# Patient Record
Sex: Male | Born: 1965 | Race: Black or African American | Hispanic: No | Marital: Single | Smoking: Never smoker
Health system: Southern US, Community
[De-identification: ages and names within clinical notes are randomized; demographics above are authoritative.]

## PROBLEM LIST (undated history)

## (undated) DIAGNOSIS — Z8669 Personal history of other diseases of the nervous system and sense organs: Secondary | ICD-10-CM

## (undated) DIAGNOSIS — E119 Type 2 diabetes mellitus without complications: Secondary | ICD-10-CM

## (undated) DIAGNOSIS — K429 Umbilical hernia without obstruction or gangrene: Secondary | ICD-10-CM

## (undated) DIAGNOSIS — I10 Essential (primary) hypertension: Secondary | ICD-10-CM

## (undated) DIAGNOSIS — Z87898 Personal history of other specified conditions: Secondary | ICD-10-CM

## (undated) DIAGNOSIS — K5900 Constipation, unspecified: Secondary | ICD-10-CM

## (undated) DIAGNOSIS — R9431 Abnormal electrocardiogram [ECG] [EKG]: Secondary | ICD-10-CM

## (undated) DIAGNOSIS — N138 Other obstructive and reflux uropathy: Secondary | ICD-10-CM

## (undated) DIAGNOSIS — K6389 Other specified diseases of intestine: Secondary | ICD-10-CM

## (undated) DIAGNOSIS — K219 Gastro-esophageal reflux disease without esophagitis: Secondary | ICD-10-CM

## (undated) DIAGNOSIS — N289 Disorder of kidney and ureter, unspecified: Secondary | ICD-10-CM

## (undated) HISTORY — DX: Disorder of kidney and ureter, unspecified: N28.9

## (undated) HISTORY — PX: INGUINAL HERNIA REPAIR: SUR1180

## (undated) HISTORY — DX: Other specified diseases of intestine: K63.89

---

## 1999-04-21 ENCOUNTER — Encounter: Payer: Self-pay | Admitting: Gastroenterology

## 1999-04-21 ENCOUNTER — Encounter: Admission: RE | Admit: 1999-04-21 | Discharge: 1999-04-21 | Payer: Self-pay | Admitting: Gastroenterology

## 2001-03-23 ENCOUNTER — Emergency Department (HOSPITAL_COMMUNITY): Admission: EM | Admit: 2001-03-23 | Discharge: 2001-03-23 | Payer: Self-pay | Admitting: Emergency Medicine

## 2002-01-15 ENCOUNTER — Emergency Department (HOSPITAL_COMMUNITY): Admission: EM | Admit: 2002-01-15 | Discharge: 2002-01-15 | Payer: Self-pay | Admitting: Emergency Medicine

## 2002-01-24 ENCOUNTER — Encounter: Admission: RE | Admit: 2002-01-24 | Discharge: 2002-01-24 | Payer: Self-pay | Admitting: Gastroenterology

## 2002-01-24 ENCOUNTER — Encounter: Payer: Self-pay | Admitting: Gastroenterology

## 2002-02-12 ENCOUNTER — Ambulatory Visit (HOSPITAL_COMMUNITY): Admission: RE | Admit: 2002-02-12 | Discharge: 2002-02-12 | Payer: Self-pay | Admitting: Gastroenterology

## 2002-11-02 ENCOUNTER — Encounter: Payer: Self-pay | Admitting: Emergency Medicine

## 2002-11-02 ENCOUNTER — Emergency Department (HOSPITAL_COMMUNITY): Admission: EM | Admit: 2002-11-02 | Discharge: 2002-11-02 | Payer: Self-pay

## 2003-01-14 ENCOUNTER — Ambulatory Visit (HOSPITAL_COMMUNITY): Admission: RE | Admit: 2003-01-14 | Discharge: 2003-01-14 | Payer: Self-pay | Admitting: Gastroenterology

## 2006-05-30 ENCOUNTER — Ambulatory Visit: Payer: Self-pay | Admitting: Vascular Surgery

## 2006-07-10 ENCOUNTER — Emergency Department (HOSPITAL_COMMUNITY): Admission: EM | Admit: 2006-07-10 | Discharge: 2006-07-10 | Payer: Self-pay | Admitting: Emergency Medicine

## 2006-10-23 ENCOUNTER — Ambulatory Visit: Payer: Self-pay | Admitting: Vascular Surgery

## 2006-10-30 ENCOUNTER — Ambulatory Visit: Payer: Self-pay | Admitting: Vascular Surgery

## 2007-01-29 ENCOUNTER — Ambulatory Visit: Payer: Self-pay | Admitting: Vascular Surgery

## 2008-10-11 ENCOUNTER — Emergency Department (HOSPITAL_COMMUNITY): Admission: EM | Admit: 2008-10-11 | Discharge: 2008-10-11 | Payer: Self-pay | Admitting: Emergency Medicine

## 2009-08-01 ENCOUNTER — Emergency Department (HOSPITAL_COMMUNITY): Admission: EM | Admit: 2009-08-01 | Discharge: 2009-08-01 | Payer: Self-pay | Admitting: Emergency Medicine

## 2009-08-21 ENCOUNTER — Telehealth: Payer: Self-pay | Admitting: Gastroenterology

## 2010-03-09 NOTE — Progress Notes (Signed)
Summary: Schedule Colonoscopy  Phone Note Outgoing Call Call back at Home Phone [phone]   Call placed by: Harlow Mares CMA Duncan Dull),  August 21, 2009 11:33 AM Call placed to: Patient Summary of Call: Left message on patients machine to call back, pt needs to schedule a colonoscopy Initial call taken by: Harlow Mares CMA Duncan Dull),  August 21, 2009 11:33 AM  Follow-up for Phone Call        Left a message on the patient machine to call back and schedule a previsit and procedure with our office. A letter will be mailed to the patient.   Follow-up by: Harlow Mares CMA Duncan Dull),  August 28, 2009 4:13 PM

## 2010-05-14 LAB — DIFFERENTIAL
Lymphocytes Relative: 21 % (ref 12–46)
Lymphs Abs: 1.5 10*3/uL (ref 0.7–4.0)
Monocytes Absolute: 0.3 10*3/uL (ref 0.1–1.0)
Monocytes Relative: 4 % (ref 3–12)
Neutro Abs: 4.7 10*3/uL (ref 1.7–7.7)

## 2010-05-14 LAB — CBC
Hemoglobin: 15.3 g/dL (ref 13.0–17.0)
MCHC: 33.9 g/dL (ref 30.0–36.0)
RBC: 5.21 MIL/uL (ref 4.22–5.81)
WBC: 7.4 10*3/uL (ref 4.0–10.5)

## 2010-05-14 LAB — POCT I-STAT, CHEM 8
BUN: 17 mg/dL (ref 6–23)
Calcium, Ion: 1.18 mmol/L (ref 1.12–1.32)
Chloride: 103 mEq/L (ref 96–112)
Creatinine, Ser: 1.3 mg/dL (ref 0.4–1.5)
Glucose, Bld: 170 mg/dL — ABNORMAL HIGH (ref 70–99)

## 2010-06-22 NOTE — Procedures (Signed)
DUPLEX DEEP VENOUS EXAM - LOWER EXTREMITY   INDICATION:  Follow up left greater saphenous vein closure.   HISTORY:  Edema:  No.  Trauma/Surgery:  Yes.  Pain:  No.  PE:  No.  Previous DVT:  No.  Anticoagulants:  Other:   DUPLEX EXAM:                CFV   SFV   PopV  PTV    GSV                R  L  R  L  R  L  R   L  R  L  Thrombosis    o  o     o     o      o     o  Spontaneous   +  +     +     +      +     +  Phasic        +  +     +     +      +     +  Augmentation  +  +     +     +      +     +  Compressible  +  +     +     +      +     +  Competent     +  +     +     +      +     +   Legend:  + - yes  o - no  p - partial  D - decreased   IMPRESSION:  1. No evidence of  deep venous thrombosis noted in left leg.  2. Left greater saphenous appears closed status post left greater      saphenous vein closure.  3. A perforator that measured 0.62 cm noted in the left calf region.    _____________________________  Quita Skye Hart [place], M.D.   MG/MEDQ  D:  01/29/2007  T:  01/30/2007  Job:  [id number]

## 2010-06-22 NOTE — Assessment & Plan Note (Signed)
OFFICE VISIT   Stanley Chapman, Stanley Chapman  DOB:  25-Jul-1965                                       10/30/2006  ZOXWR#:[id number]   The patient underwent laser ablation of his left greater saphenous vein  with multiple stab phlebectomies on 10/23/2006 for severe venous  insufficiency and painful varicosities.  He has had an excellent early  result with no evidence of any distal swelling or pain in the calf or  ankle area.  He does have some mild to moderate tenderness over the  course of the greater saphenous vein, which is improving on a daily  basis.  He has taken his ibuprofen as prescribed and has worn the  elastic compression stocking.  I performed a limited venous duplex exam  today in the office.  His deep venous system is widely patent with a  normal flow and no evidence of deep venous thrombosis.  Saphenous vein  is occluded from just proximal to the saphenofemoral junction to the  knee and totally non-compressible.  He was reassured regarding these  findings.  He will return in 3 months for final follow up.   Quita Skye Hart Rochester, M.D.  Electronically Signed   JDL/MEDQ  D:  10/30/2006  Chapman:  10/31/2006  Job:  [id number]

## 2010-06-22 NOTE — Assessment & Plan Note (Signed)
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  He had greater  than 20 stab phlebectomy sites as well.  This has healed nicely and he  does have a few small residual varicosities in the calf.  He states his  leg feels much better than it did prior to his laser ablation procedure,  with the heaviness, aching, and throbbing gone now.  He does have mild  edema in the left leg in the pretibial region.  He was concerned about a  few dark spots, which represent some areas of hyperpigmentation, which  seem to be stable and should not progress.  He has excellent arterial  pulses distally and well-perfused lower extremities.   Venous duplex exam was performed in the lab today, which revealed  successful closure of the left greater saphenous vein.  There is 1  prominent perforator in the calf measuring 0.62 cm communicating with  the superficial and deep system, which could be addressed if he develops  worsening varicosities, et Karie Soda.  Return to see [place] on a p.r.n. basis.   Quita Skye Hart [place], M.D.  Electronically Signed   JDL/MEDQ  D:  01/29/2007  T:  01/30/2007  Job:  [id number]

## 2010-06-25 NOTE — Op Note (Signed)
   NAMEGAYLEN, Stanley Chapman                       ACCOUNT NO.:  [id number]   MEDICAL RECORD NO.:  [id number]                   PATIENT TYPE:  AMB   LOCATION:  ENDO                                 FACILITY:  [facility]   PHYSICIAN:  John C. Madilyn Fireman, M.D.                 DATE OF BIRTH:  September 06, 1965   DATE OF PROCEDURE:  02/12/2002  DATE OF DISCHARGE:                                 OPERATIVE REPORT   PROCEDURE:  Colonoscopy.   INDICATIONS FOR PROCEDURE:  Worsening right lower quadrant abdominal pain  very bothersome to the patient with a history of metastatic cancer of  unknown origin in his mother.  CT scan has been unrevealing.  He has had  some sort of surgery involving a right hernia in the past. Procedure is to  assess the cecum, terminal ileum, and appendix for any obvious source of his  pain.   DESCRIPTION OF PROCEDURE:  The patient was placed in the left lateral  decubitus position and placed on the pulse monitor with continuous low-flow  oxygen delivered by nasal cannula.  He was sedated with 100 mcg IV fentanyl  and 10 mg IV Versed.  The Olympus video colonoscope was inserted into the  rectum and advanced to the cecum, confirmed by transillumination at  McBurney's point and visualization of the ileocecal valve and appendiceal  orifice.  The terminal ileum was intubated and appeared to be within normal  limits as did the appendiceal orifice and cecum.  The ascending colon  likewise appeared normal as did the transverse, descending, sigmoid, and  rectum.  The scope was then withdrawn, and the patient returned to the  recovery room in stable condition.  He tolerated the procedure well, and  there were no immediate complications.   IMPRESSION:  Normal colonoscopy including terminal ileum.   PLAN:  I am not sure what else to offer the patient.  He was interested in  surgical consult regarding possible pain related to his prior hernia, and we  will arrange this if he desires.                                            John C. Madilyn Fireman, M.D.    JCH/MEDQ  D:  02/12/2002  T:  02/12/2002  Job:  [id number]

## 2010-11-25 LAB — I-STAT 8, (EC8 V) (CONVERTED LAB)
Bicarbonate: 26.9 — ABNORMAL HIGH
Hemoglobin: 17
Operator id: 151321
Sodium: 140
TCO2: 28
pCO2, Ven: 51.1 — ABNORMAL HIGH

## 2010-11-25 LAB — POCT I-STAT CREATININE: Operator id: 151321

## 2010-11-25 LAB — POCT CARDIAC MARKERS
CKMB, poc: 2.3
Troponin i, poc: <0.05

## 2011-03-19 ENCOUNTER — Emergency Department (HOSPITAL_COMMUNITY): Payer: Self-pay

## 2011-03-19 ENCOUNTER — Encounter (HOSPITAL_COMMUNITY): Payer: Self-pay | Admitting: *Deleted

## 2011-03-19 ENCOUNTER — Observation Stay (HOSPITAL_COMMUNITY): Payer: Self-pay

## 2011-03-19 ENCOUNTER — Other Ambulatory Visit: Payer: Self-pay

## 2011-03-19 ENCOUNTER — Observation Stay (HOSPITAL_COMMUNITY)
Admission: EM | Admit: 2011-03-19 | Discharge: 2011-03-19 | Disposition: A | Discharge status: Home / Self Care | Payer: Self-pay | Attending: Internal Medicine | Admitting: Internal Medicine

## 2011-03-19 DIAGNOSIS — R079 Chest pain, unspecified: Principal | ICD-10-CM | POA: Insufficient documentation

## 2011-03-19 DIAGNOSIS — R739 Hyperglycemia, unspecified: Secondary | ICD-10-CM

## 2011-03-19 DIAGNOSIS — R0602 Shortness of breath: Secondary | ICD-10-CM | POA: Insufficient documentation

## 2011-03-19 LAB — POCT I-STAT, CHEM 8
BUN: 14 mg/dL (ref 6–23)
Calcium, Ion: 1.17 mmol/L (ref 1.12–1.32)
Chloride: 102 mEq/L (ref 96–112)
Glucose, Bld: 158 mg/dL — ABNORMAL HIGH (ref 70–99)
HCT: 47 % (ref 39.0–52.0)
Hemoglobin: 16 g/dL (ref 13.0–17.0)

## 2011-03-19 LAB — DIFFERENTIAL
Basophils Absolute: 0 10*3/uL (ref 0.0–0.1)
Basophils Relative: 1 % (ref 0–1)
Eosinophils Absolute: 0.4 10*3/uL (ref 0.0–0.7)
Eosinophils Relative: 6 % — ABNORMAL HIGH (ref 0–5)
Monocytes Absolute: 0.4 10*3/uL (ref 0.1–1.0)
Monocytes Relative: 6 % (ref 3–12)
Neutro Abs: 3.5 10*3/uL (ref 1.7–7.7)
Neutrophils Relative %: 51 % (ref 43–77)

## 2011-03-19 LAB — POCT I-STAT TROPONIN I: Troponin i, poc: 0 ng/mL (ref 0.00–0.08)

## 2011-03-19 LAB — CBC
HCT: 44.4 % (ref 39.0–52.0)
MCH: 29.1 pg (ref 26.0–34.0)
MCHC: 34.7 g/dL (ref 30.0–36.0)
RBC: 5.29 MIL/uL (ref 4.22–5.81)
RDW: 13.2 % (ref 11.5–15.5)

## 2011-03-19 LAB — HEMOGLOBIN A1C: Mean Plasma Glucose: 146 mg/dL — ABNORMAL HIGH (ref <117)

## 2011-03-19 MED ORDER — METOPROLOL TARTRATE 1 MG/ML IV SOLN
5.0000 mg | Freq: Once | INTRAVENOUS | Status: AC
  Administered 2011-03-19: 5 mg via INTRAVENOUS
  Filled 2011-03-19: qty 5

## 2011-03-19 MED ORDER — IOHEXOL 350 MG/ML SOLN: 80.0000 mL | Freq: Once | INTRAVENOUS | Status: DC | PRN

## 2011-03-19 MED ORDER — SODIUM CHLORIDE 0.9 % IV SOLN
Freq: Once | INTRAVENOUS | Status: AC
  Administered 2011-03-19: 08:00:00 via INTRAVENOUS

## 2011-03-19 MED ORDER — METOPROLOL TARTRATE 1 MG/ML IV SOLN
5.0000 mg | INTRAVENOUS | Status: AC
  Administered 2011-03-19: 2.5 mg via INTRAVASCULAR
  Administered 2011-03-19: 5 mg via INTRAVENOUS

## 2011-03-19 MED ORDER — METOPROLOL TARTRATE 1 MG/ML IV SOLN
INTRAVENOUS | Status: AC
  Administered 2011-03-19: 2.5 mg via INTRAVASCULAR
  Filled 2011-03-19: qty 15

## 2011-03-19 MED ORDER — NITROGLYCERIN 0.4 MG SL SUBL
SUBLINGUAL_TABLET | SUBLINGUAL | Status: AC
  Filled 2011-03-19: qty 25

## 2011-03-19 MED ORDER — NITROGLYCERIN 0.4 MG SL SUBL
0.4000 mg | SUBLINGUAL_TABLET | Freq: Once | SUBLINGUAL | Status: AC
  Administered 2011-03-19: 0.4 mg via SUBLINGUAL

## 2011-03-19 MED ORDER — ASPIRIN 81 MG PO CHEW
324.0000 mg | CHEWABLE_TABLET | Freq: Once | ORAL | Status: AC
  Administered 2011-03-19: 324 mg via ORAL
  Filled 2011-03-19: qty 4

## 2011-03-19 NOTE — ED Notes (Signed)
Pt resting comfortably. Admitting MD at bedside.

## 2011-03-19 NOTE — ED Notes (Signed)
Bosie Clos, Secretary, to advise [facility] to cancel bed request - d/t entered incorrectly.

## 2011-03-19 NOTE — ED Provider Notes (Signed)
Complains of intermittent chest pain for 2 days asymptomatic at present. Dr. Shari Heritage had arranged for inpatient stay however patient wished to sign out AMA. I convinced him to stay for chest pain rule out protocol.Marland Kitchen He is sufficiently low risk  Doug Sou, MD 03/19/11 1719

## 2011-03-19 NOTE — ED Provider Notes (Signed)
Medical screening examination/treatment/procedure(s) were conducted as a shared visit with non-physician practitioner(s) and myself.  I personally evaluated the patient during the encounter  Doug Sou, MD 03/19/11 1702

## 2011-03-19 NOTE — ED Provider Notes (Signed)
History     CSN: [id number]  Arrival date & time 03/19/11  0535   First MD Initiated Contact with Patient 03/19/11 0559      Chief Complaint  Patient presents with  . Chest Pain    (Consider location/radiation/quality/duration/timing/severity/associated sxs/prior treatment) HPI This is a 46 year old black male with no significant past medical history. He is here with a two-day history of intermittent chest pain. He states the pain is sharp and located in a vertical line left of his sternum. It occurs about every 10 minutes and last several minutes at a time. It is accompanied by shortness of breath. It is not accompanied by nausea or diaphoresis. There are no known exacerbating or mitigating factors. The pain is worsening, and has been an 8/10 at its worst. He has a family history of diabetes but no coronary artery disease. He complains of frequent urination and pitting edema of his lower legs.  History reviewed. No pertinent past medical history.  History reviewed. No pertinent past surgical history.  History reviewed. No pertinent family history.  History  Substance Use Topics  . Smoking status: Never Smoker   . Smokeless tobacco: Not on file  . Alcohol Use: No      Review of Systems  All other systems reviewed and are negative.    Allergies  Review of patient's allergies indicates no known allergies.  Home Medications   Current Outpatient Rx  Name Route Sig Dispense Refill  . THERA M PLUS PO TABS Oral Take 1 tablet by mouth daily.    Marland Kitchen OMEPRAZOLE 20 MG PO CPDR Oral Take 20 mg by mouth daily.      BP 151/91  Pulse 80  Temp 98.6 F (37 C)  Resp 18  SpO2 100%  Physical Exam General: Well-developed, well-nourished male in no acute distress; appearance consistent with age of record HENT: normocephalic, atraumatic Eyes: pupils equal round and reactive to light; extraocular muscles intact Neck: supple Heart: regular rate and rhythm; no murmurs, rubs or  gallops Lungs: clear to auscultation bilaterally Abdomen: soft; nondistended; nontender; no masses or hepatosplenomegaly; bowel sounds present; small reducible umbilical hernia Extremities: No deformity; full range of motion; trace edema of lower legs Neurologic: Awake, alert and oriented; motor function intact in all extremities and symmetric; no facial droop Skin: Warm and dry     ED Course  Procedures (including critical care time)     MDM  EKG Interpretation:  Date & Time: 03/19/2011 5:40 AM  Rate: 78  Rhythm: normal sinus rhythm  QRS Axis: normal  Intervals: normal  ST/T Wave abnormalities: T wave inversion in lead III and aVF; T-wave inversion in lead III previously seen  Conduction Disutrbances:left posterior fascicular block  Narrative Interpretation:   Old EKG Reviewed: Unchanged except inverted T wave in aVF as noted above  Nursing notes and vitals signs, including pulse oximetry, reviewed.  Summary of this visit's results, reviewed by myself:  Labs:  Results for orders placed during the hospital encounter of 03/19/11  CBC      Component Value Range   WBC 6.9  4.0 - 10.5 (K/uL)   RBC 5.29  4.22 - 5.81 (MIL/uL)   Hemoglobin 15.4  13.0 - 17.0 (g/dL)   HCT 40.9  81.1 - 91.4 (%)   MCV 83.9  78.0 - 100.0 (fL)   MCH 29.1  26.0 - 34.0 (pg)   MCHC 34.7  30.0 - 36.0 (g/dL)   RDW 78.2  95.6 - 21.3 (%)   Platelets  PENDING  150 - 400 (K/uL)  DIFFERENTIAL      Component Value Range   Neutrophils Relative 51  43 - 77 (%)   Neutro Abs 3.5  1.7 - 7.7 (K/uL)   Lymphocytes Relative 37  12 - 46 (%)   Lymphs Abs 2.5  0.7 - 4.0 (K/uL)   Monocytes Relative 6  3 - 12 (%)   Monocytes Absolute 0.4  0.1 - 1.0 (K/uL)   Eosinophils Relative 6 (*) 0 - 5 (%)   Eosinophils Absolute 0.4  0.0 - 0.7 (K/uL)   Basophils Relative 1  0 - 1 (%)   Basophils Absolute 0.0  0.0 - 0.1 (K/uL)  POCT I-STAT, CHEM 8      Component Value Range   Sodium 140  135 - 145 (mEq/L)   Potassium 3.6  3.5  - 5.1 (mEq/L)   Chloride 102  96 - 112 (mEq/L)   BUN 14  6 - 23 (mg/dL)   Creatinine, Ser 9.60  0.50 - 1.35 (mg/dL)   Glucose, Bld 454 (*) 70 - 99 (mg/dL)   Calcium, Ion 0.98  1.19 - 1.32 (mmol/L)   TCO2 27  0 - 100 (mmol/L)   Hemoglobin 16.0  13.0 - 17.0 (g/dL)   HCT 14.7  82.9 - 56.2 (%)  POCT I-STAT TROPONIN I      Component Value Range   Troponin i, poc 0.00  0.00 - 0.08 (ng/mL)   Comment 3             Imaging Studies: Dg Chest Port 1 View  03/19/2011  *RADIOLOGY REPORT*  Clinical Data: Chest pain and shortness of breath.  PORTABLE CHEST - 1 VIEW  Comparison: 09/09/2006  Findings: Shallow inspiration.  Heart size and pulmonary vascularity are normal for technique.  No focal airspace consolidation in the lungs.  No blunting of costophrenic angles. No pneumothorax.  IMPRESSION: No evidence of active pulmonary disease.  Original Report Authenticated By: Marlon Pel, M.D.   7:08 AM Will admit to [facility].       Hanley Seamen, MD 03/19/11 [phone]

## 2011-03-19 NOTE — ED Notes (Signed)
Pt reports intermittent CP x 2 days with radiation to (L) upper arm and (L) chest-increased pain tonight.  deneis N/V.  Reports SOB.  Denies diaphoresis.

## 2011-03-19 NOTE — ED Notes (Signed)
Spoke w/Laura, CT Tech, to advise her of pending cardiac CT.  She advised will check with CT Tech who comes in at 9am to see if can perform.

## 2011-03-19 NOTE — ED Notes (Signed)
Pt remains on CT table.  Pt's HR 62-75.

## 2011-03-19 NOTE — ED Notes (Signed)
C/o L side CP, radiates into L shoulder, comes and goes, lasts 2-3 minutes, comes sometimes ~ every 15 minutes, can't breath when the pain comes, (denies: sob, bleeding, nvd, dizziness, palpitations, diaphoresis, cough, congestion, cold sx, fever or other sx). Last BM yesterday (normal for pt, deals with constipation, takes herbal laxative), last ate snack ~ 2 hrs ago. Also mentions intermittant RLQ pain.

## 2011-03-19 NOTE — ED Provider Notes (Signed)
I have discussed with pt the chest pain protocol.  He is currently in agreement.  He would like to go home today if possible.  I have discussed case with Dr. Ethelda Chick.  Once CT is performed, I'll re-address chest pain and close f/u with a pcp.  CT was normal per radiology verbal report.  Plan to f/u with pcp in 1-2 wks for close evaluation and further management of elevated glucose.  Pt voiced understanding and compliance.  Lindley Magnus [place], [place] 03/19/11 [phone]

## 2011-03-19 NOTE — ED Notes (Signed)
Tresa Endo, CT Tech, called and advised should be ready in approx 10 minutes - pt aware.

## 2011-03-19 NOTE — ED Notes (Signed)
EDP into room. RN at [facility] attempting IV.

## 2011-03-21 MED ORDER — IOHEXOL 350 MG/ML SOLN
80.0000 mL | Freq: Once | INTRAVENOUS | Status: AC | PRN
  Administered 2011-03-21: 80 mL via INTRAVENOUS

## 2011-06-24 ENCOUNTER — Encounter: Payer: Self-pay | Admitting: Gastroenterology

## 2013-03-04 ENCOUNTER — Encounter (HOSPITAL_COMMUNITY): Payer: Self-pay | Admitting: Emergency Medicine

## 2013-03-04 ENCOUNTER — Emergency Department (INDEPENDENT_AMBULATORY_CARE_PROVIDER_SITE_OTHER)
Admission: EM | Admit: 2013-03-04 | Discharge: 2013-03-04 | Disposition: A | Discharge status: Home / Self Care | Payer: Self-pay | Source: Home / Self Care

## 2013-03-04 DIAGNOSIS — E119 Type 2 diabetes mellitus without complications: Secondary | ICD-10-CM

## 2013-03-04 DIAGNOSIS — R799 Abnormal finding of blood chemistry, unspecified: Secondary | ICD-10-CM

## 2013-03-04 DIAGNOSIS — R609 Edema, unspecified: Secondary | ICD-10-CM

## 2013-03-04 DIAGNOSIS — I10 Essential (primary) hypertension: Secondary | ICD-10-CM

## 2013-03-04 DIAGNOSIS — R7309 Other abnormal glucose: Secondary | ICD-10-CM

## 2013-03-04 DIAGNOSIS — R3589 Other polyuria: Secondary | ICD-10-CM

## 2013-03-04 DIAGNOSIS — R600 Localized edema: Secondary | ICD-10-CM

## 2013-03-04 DIAGNOSIS — R358 Other polyuria: Secondary | ICD-10-CM

## 2013-03-04 DIAGNOSIS — N400 Enlarged prostate without lower urinary tract symptoms: Secondary | ICD-10-CM

## 2013-03-04 DIAGNOSIS — R7989 Other specified abnormal findings of blood chemistry: Secondary | ICD-10-CM

## 2013-03-04 DIAGNOSIS — R739 Hyperglycemia, unspecified: Secondary | ICD-10-CM

## 2013-03-04 LAB — POCT I-STAT, CHEM 8
BUN: 19 mg/dL (ref 6–23)
CALCIUM ION: 1.28 mmol/L — AB (ref 1.12–1.23)
CREATININE: 1.4 mg/dL — AB (ref 0.50–1.35)
Chloride: 100 mEq/L (ref 96–112)
GLUCOSE: 290 mg/dL — AB (ref 70–99)
HCT: 49 % (ref 39.0–52.0)
HEMOGLOBIN: 16.7 g/dL (ref 13.0–17.0)
POTASSIUM: 3.8 meq/L (ref 3.7–5.3)
Sodium: 140 mEq/L (ref 137–147)
TCO2: 28 mmol/L (ref 0–100)

## 2013-03-04 MED ORDER — LOSARTAN POTASSIUM-HCTZ 50-12.5 MG PO TABS: 1.0000 | ORAL_TABLET | Freq: Every day | ORAL | Status: DC

## 2013-03-04 MED ORDER — TAMSULOSIN HCL 0.4 MG PO CAPS: 0.4000 mg | ORAL_CAPSULE | Freq: Every day | ORAL | Status: DC

## 2013-03-04 NOTE — ED Provider Notes (Signed)
Medical screening examination/treatment/procedure(s) were performed by non-physician practitioner and as supervising physician I was immediately available for consultation/collaboration.  Yaminah Clayborn, M.D.   Norva Bowe C Ta Fair, MD 03/04/13 1038 

## 2013-03-04 NOTE — ED Notes (Signed)
Pt c/o bilateral leg swelling onset 2 to 3 yrs No PCP Also c/o RLQ pain onset 3 yrs Alert w/no signs of acute distress.

## 2013-03-04 NOTE — Discharge Instructions (Signed)
Benign Prostatic Hyperplasia An enlarged prostate (benign prostatic hyperplasia) is common in older men. You may experience the following:  Weak urine stream.  Dribbling.  Feeling like the bladder has not emptied completely.  Difficulty starting urination.  Getting up frequently at night to urinate.  Urinating more frequently during the day. HOME CARE INSTRUCTIONS  Monitor your prostatic hyperplasia for any changes. The following actions may help to alleviate any discomfort you are experiencing:  Give yourself time when you urinate.  Stay away from alcohol.  Avoid beverages containing caffeine, such as coffee, tea, and colas, because they can make the problem worse.  Avoid decongestants, antihistamines, and some prescription medicines that can make the problem worse.  Follow up with your health care provider for further treatment as recommended. SEEK MEDICAL CARE IF:  You are experiencing progressive difficulty voiding.  Your urine stream is progressively getting narrower.  You are awaking from sleep with the urge to void more frequently.  You are constantly feeling the need to void.  You experience loss of urine, especially in small amounts. SEEK IMMEDIATE MEDICAL CARE IF:   You develop increased pain with urination or are unable to urinate.  You develop severe abdominal pain, vomiting, a high fever, or fainting.  You develop back pain or blood in your urine. MAKE SURE YOU:   Understand these instructions.  Will watch your condition.  Will get help right away if you are not doing well or get worse. Document Released: 01/24/2005 Document Revised: 09/26/2012 Document Reviewed: 06/26/2012 [facility] Patient Information 2014 [place].  Edema Edema is an abnormal build-up of fluids in tissues. Because this is partly dependent on gravity (water flows to the lowest place), it is more common in the legs and thighs (lower extremities). It is also common in the  looser tissues, like around the eyes. Painless swelling of the feet and ankles is common and increases as a person ages. It may affect both legs and may include the calves or even thighs. When squeezed, the fluid may move out of the affected area and may leave a dent for a few moments. CAUSES   Prolonged standing or sitting in one place for extended periods of time. Movement helps pump tissue fluid into the veins, and absence of movement prevents this, resulting in edema.  Varicose veins. The valves in the veins do not work as well as they should. This causes fluid to leak into the tissues.  Fluid and salt overload.  Injury, burn, or surgery to the leg, ankle, or foot, may damage veins and allow fluid to leak out.  Sunburn damages vessels. Leaky vessels allow fluid to go out into the sunburned tissues.  Allergies (from insect bites or stings, medications or chemicals) cause swelling by allowing vessels to become leaky.  Protein in the blood helps keep fluid in your vessels. Low protein, as in malnutrition, allows fluid to leak out.  Hormonal changes, including pregnancy and menstruation, cause fluid retention. This fluid may leak out of vessels and cause edema.  Medications that cause fluid retention. Examples are sex hormones, blood pressure medications, steroid treatment, or anti-depressants.  Some illnesses cause edema, especially heart failure, kidney disease, or liver disease.  Surgery that cuts veins or lymph nodes, such as surgery done for the heart or for breast cancer, may result in edema. DIAGNOSIS  Your caregiver is usually easily able to determine what is causing your swelling (edema) by simply asking what is wrong (getting a history) and examining you (doing  a physical). Sometimes x-rays, EKG (electrocardiogram or heart tracing), and blood work may be done to evaluate for underlying medical illness. TREATMENT  General treatment includes:  Leg elevation (or elevation of the  affected body part).  Restriction of fluid intake.  Prevention of fluid overload.  Compression of the affected body part. Compression with elastic bandages or support stockings squeezes the tissues, preventing fluid from entering and forcing it back into the blood vessels.  Diuretics (also called water pills or fluid pills) pull fluid out of your body in the form of increased urination. These are effective in reducing the swelling, but can have side effects and must be used only under your caregiver's supervision. Diuretics are appropriate only for some types of edema. The specific treatment can be directed at any underlying causes discovered. Heart, liver, or kidney disease should be treated appropriately. HOME CARE INSTRUCTIONS   Elevate the legs (or affected body part) above the level of the heart, while lying down.  Avoid sitting or standing still for prolonged periods of time.  Avoid putting anything directly under the knees when lying down, and do not wear constricting clothing or garters on the upper legs.  Exercising the legs causes the fluid to work back into the veins and lymphatic channels. This may help the swelling go down.  The pressure applied by elastic bandages or support stockings can help reduce ankle swelling.  A low-salt diet may help reduce fluid retention and decrease the ankle swelling.  Take any medications exactly as prescribed. SEEK MEDICAL CARE IF:  Your edema is not responding to recommended treatments. SEEK IMMEDIATE MEDICAL CARE IF:   You develop shortness of breath or chest pain.  You cannot breathe when you lay down; or if, while lying down, you have to get up and go to the window to get your breath.  You are having increasing swelling without relief from treatment.  You develop a fever over 102 F (38.9 C).  You develop pain or redness in the areas that are swollen.  Tell your caregiver right away if you have gained 03 lb/1.4 kg in 1 day or 05  lb/2.3 kg in a week. MAKE SURE YOU:   Understand these instructions.  Will watch your condition.  Will get help right away if you are not doing well or get worse. Document Released: 01/24/2005 Document Revised: 07/26/2011 Document Reviewed: 09/12/2007 [facility] Patient Information 2014 [place].  Hypertension As your heart beats, it forces blood through your arteries. This force is your blood pressure. If the pressure is too high, it is called hypertension (HTN) or high blood pressure. HTN is dangerous because you may have it and not know it. High blood pressure may mean that your heart has to work harder to pump blood. Your arteries may be narrow or stiff. The extra work puts you at risk for heart disease, stroke, and other problems.  Blood pressure consists of two numbers, a higher number over a lower, 110/72, for example. It is stated as "110 over 72." The ideal is below 120 for the top number (systolic) and under 80 for the bottom (diastolic). Write down your blood pressure today. You should pay close attention to your blood pressure if you have certain conditions such as:  Heart failure.  Prior heart attack.  Diabetes  Chronic kidney disease.  Prior stroke.  Multiple risk factors for heart disease. To see if you have HTN, your blood pressure should be measured while you are seated with your arm  held at the level of the heart. It should be measured at least twice. A one-time elevated blood pressure reading (especially in the Emergency Department) does not mean that you need treatment. There may be conditions in which the blood pressure is different between your right and left arms. It is important to see your caregiver soon for a recheck. Most people have essential hypertension which means that there is not a specific cause. This type of high blood pressure may be lowered by changing lifestyle factors such as:  Stress.  Smoking.  Lack of exercise.  Excessive  weight.  Drug/tobacco/alcohol use.  Eating less salt. Most people do not have symptoms from high blood pressure until it has caused damage to the body. Effective treatment can often prevent, delay or reduce that damage. TREATMENT  When a cause has been identified, treatment for high blood pressure is directed at the cause. There are a large number of medications to treat HTN. These fall into several categories, and your caregiver will help you select the medicines that are best for you. Medications may have side effects. You should review side effects with your caregiver. If your blood pressure stays high after you have made lifestyle changes or started on medicines,   Your medication(s) may need to be changed.  Other problems may need to be addressed.  Be certain you understand your prescriptions, and know how and when to take your medicine.  Be sure to follow up with your caregiver within the time frame advised (usually within two weeks) to have your blood pressure rechecked and to review your medications.  If you are taking more than one medicine to lower your blood pressure, make sure you know how and at what times they should be taken. Taking two medicines at the same time can result in blood pressure that is too low. SEEK IMMEDIATE MEDICAL CARE IF:  You develop a severe headache, blurred or changing vision, or confusion.  You have unusual weakness or numbness, or a faint feeling.  You have severe chest or abdominal pain, vomiting, or breathing problems. MAKE SURE YOU:   Understand these instructions.  Will watch your condition.  Will get help right away if you are not doing well or get worse. Document Released: 01/24/2005 Document Revised: 04/18/2011 Document Reviewed: 09/14/2007 [facility] Patient Information 2014 [place].  Peripheral Edema You have swelling in your legs (peripheral edema). This swelling is due to excess accumulation of salt and water in your body.  Edema may be a sign of heart, kidney or liver disease, or a side effect of a medication. It may also be due to problems in the leg veins. Elevating your legs and using special support stockings may be very helpful, if the cause of the swelling is due to poor venous circulation. Avoid long periods of standing, whatever the cause. Treatment of edema depends on identifying the cause. Chips, pretzels, pickles and other salty foods should be avoided. Restricting salt in your diet is almost always needed. Water pills (diuretics) are often used to remove the excess salt and water from your body via urine. These medicines prevent the kidney from reabsorbing sodium. This increases urine flow. Diuretic treatment may also result in lowering of potassium levels in your body. Potassium supplements may be needed if you have to use diuretics daily. Daily weights can help you keep track of your progress in clearing your edema. You should call your caregiver for follow up care as recommended. SEEK IMMEDIATE MEDICAL CARE IF:  You have increased swelling, pain, redness, or heat in your legs.  You develop shortness of breath, especially when lying down.  You develop chest or abdominal pain, weakness, or fainting.  You have a fever. Document Released: 03/03/2004 Document Revised: 04/18/2011 Document Reviewed: 02/11/2009 [facility] Patient Information 2014 [place].  Type 2 Diabetes Mellitus, Adult Type 2 diabetes mellitus, often simply referred to as type 2 diabetes, is a long-lasting (chronic) disease. In type 2 diabetes, the pancreas does not make enough insulin (a hormone), the cells are less responsive to the insulin that is made (insulin resistance), or both. Normally, insulin moves sugars from food into the tissue cells. The tissue cells use the sugars for energy. The lack of insulin or the lack of normal response to insulin causes excess sugars to build up in the blood instead of going into the tissue cells.  As a result, high blood sugar (hyperglycemia) develops. The effect of high sugar (glucose) levels can cause many complications. Type 2 diabetes was also previously called adult-onset diabetes but it can occur at any age.  RISK FACTORS  A person is predisposed to developing type 2 diabetes if someone in the family has the disease and also has one or more of the following primary risk factors:  Overweight.  An inactive lifestyle.  A history of consistently eating high-calorie foods. Maintaining a normal weight and regular physical activity can reduce the chance of developing type 2 diabetes. SYMPTOMS  A person with type 2 diabetes may not show symptoms initially. The symptoms of type 2 diabetes appear slowly. The symptoms include:  Increased thirst (polydipsia).  Increased urination (polyuria).  Increased urination during the night (nocturia).  Weight loss. This weight loss may be rapid.  Frequent, recurring infections.  Tiredness (fatigue).  Weakness.  Vision changes, such as blurred vision.  Fruity smell to your breath.  Abdominal pain.  Nausea or vomiting.  Cuts or bruises which are slow to heal.  Tingling or numbness in the hands or feet. DIAGNOSIS Type 2 diabetes is frequently not diagnosed until complications of diabetes are present. Type 2 diabetes is diagnosed when symptoms or complications are present and when blood glucose levels are increased. Your blood glucose level may be checked by one or more of the following blood tests:  A fasting blood glucose test. You will not be allowed to eat for at least 8 hours before a blood sample is taken.  A random blood glucose test. Your blood glucose is checked at any time of the day regardless of when you ate.  A hemoglobin A1c blood glucose test. A hemoglobin A1c test provides information about blood glucose control over the previous 3 months.  An oral glucose tolerance test (OGTT). Your blood glucose is measured  after you have not eaten (fasted) for 2 hours and then after you drink a glucose-containing beverage. TREATMENT   You may need to take insulin or diabetes medicine daily to keep blood glucose levels in the desired range.  You will need to match insulin dosing with exercise and healthy food choices. The treatment goal is to maintain the before meal blood sugar (preprandial glucose) level at 70 130 mg/dL. HOME CARE INSTRUCTIONS   Have your hemoglobin A1c level checked twice a year.  Perform daily blood glucose monitoring as directed by your caregiver.  Monitor urine ketones when you are ill and as directed by your caregiver.  Take your diabetes medicine or insulin as directed by your caregiver to maintain your blood glucose levels  in the desired range.  Never run out of diabetes medicine or insulin. It is needed every day.  Adjust insulin based on your intake of carbohydrates. Carbohydrates can raise blood glucose levels but need to be included in your diet. Carbohydrates provide vitamins, minerals, and fiber which are an essential part of a healthy diet. Carbohydrates are found in fruits, vegetables, whole grains, dairy products, legumes, and foods containing added sugars.    Eat healthy foods. Alternate 3 meals with 3 snacks.  Lose weight if overweight.  Carry a medical alert card or wear your medical alert jewelry.  Carry a 15 gram carbohydrate snack with you at all times to treat low blood glucose (hypoglycemia). Some examples of 15 gram carbohydrate snacks include:  Glucose tablets, 3 or 4   Glucose gel, 15 gram tube  Raisins, 2 tablespoons (24 grams)  Jelly beans, 6  Animal crackers, 8  Regular pop, 4 ounces (120 mL)  Gummy treats, 9  Recognize hypoglycemia. Hypoglycemia occurs with blood glucose levels of 70 mg/dL and below. The risk for hypoglycemia increases when fasting or skipping meals, during or after intense exercise, and during sleep. Hypoglycemia symptoms  can include:  Tremors or shakes.  Decreased ability to concentrate.  Sweating.  Increased heart rate.  Headache.  Dry mouth.  Hunger.  Irritability.  Anxiety.  Restless sleep.  Altered speech or coordination.  Confusion.  Treat hypoglycemia promptly. If you are alert and able to safely swallow, follow the 15:15 rule:  Take 15 20 grams of rapid-acting glucose or carbohydrate. Rapid-acting options include glucose gel, glucose tablets, or 4 ounces (120 mL) of fruit juice, regular soda, or low fat milk.  Check your blood glucose level 15 minutes after taking the glucose.  Take 15 20 grams more of glucose if the repeat blood glucose level is still 70 mg/dL or below.  Eat a meal or snack within 1 hour once blood glucose levels return to normal.    Be alert to polyuria and polydipsia which are early signs of hyperglycemia. An early awareness of hyperglycemia allows for prompt treatment. Treat hyperglycemia as directed by your caregiver.  Engage in at least 150 minutes of moderate-intensity physical activity a week, spread over at least 3 days of the week or as directed by your caregiver. In addition, you should engage in resistance exercise at least 2 times a week or as directed by your caregiver.  Adjust your medicine and food intake as needed if you start a new exercise or sport.  Follow your sick day plan at any time you are unable to eat or drink as usual.  Avoid tobacco use.  Limit alcohol intake to no more than 1 drink per day for nonpregnant women and 2 drinks per day for men. You should drink alcohol only when you are also eating food. Talk with your caregiver whether alcohol is safe for you. Tell your caregiver if you drink alcohol several times a week.  Follow up with your caregiver regularly.  Schedule an eye exam soon after the diagnosis of type 2 diabetes and then annually.  Perform daily skin and foot care. Examine your skin and feet daily for cuts,  bruises, redness, nail problems, bleeding, blisters, or sores. A foot exam by a caregiver should be done annually.  Brush your teeth and gums at least twice a day and floss at least once a day. Follow up with your dentist regularly.  Share your diabetes management plan with your workplace or school.  Stay  up-to-date with immunizations.  Learn to manage stress.  Obtain ongoing diabetes education and support as needed.  Participate in, or seek rehabilitation as needed to maintain or improve independence and quality of life. Request a physical or occupational therapy referral if you are having foot or hand numbness or difficulties with grooming, dressing, eating, or physical activity. SEEK MEDICAL CARE IF:   You are unable to eat food or drink fluids for more than 6 hours.  You have nausea and vomiting for more than 6 hours.  Your blood glucose level is over 240 mg/dL.  There is a change in mental status.  You develop an additional serious illness.  You have diarrhea for more than 6 hours.  You have been sick or have had a fever for a couple of days and are not getting better.  You have pain during any physical activity.  SEEK IMMEDIATE MEDICAL CARE IF:  You have difficulty breathing.  You have moderate to large ketone levels. MAKE SURE YOU:  Understand these instructions.  Will watch your condition.  Will get help right away if you are not doing well or get worse. Document Released: 01/24/2005 Document Revised: 10/19/2011 Document Reviewed: 08/23/2011 [facility] Patient Information 2014 [place].

## 2013-03-04 NOTE — ED Provider Notes (Signed)
CSN: [id number]     Arrival date & time 03/04/13  0825 History   First MD Initiated Contact with Patient 03/04/13 [id number]     Chief Complaint  Patient presents with  . Leg Swelling   (Consider location/radiation/quality/duration/timing/severity/associated sxs/prior Treatment) HPI Comments: 48 year old male presents with multiple chronic complaints. He has been having urinary frequency with small volume voids for 2-3 years. States he urinates every 30-45 minutes that only "a little" comes out.  He also complains of bilateral pitting edema for several years. 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  The third complaint is that of chronic right lower quadrant pain for several years. Seems to be worse after eating and then lying down.   History reviewed. No pertinent past medical history. History reviewed. No pertinent past surgical history. No family history on file. History  Substance Use Topics  . Smoking status: Former Smoker    Types: Cigarettes  . Smokeless tobacco: Not on file  . Alcohol Use: No    Review of Systems  Constitutional: Negative.   HENT: Negative.   Respiratory: Negative.   Cardiovascular: Positive for leg swelling.  Gastrointestinal: Positive for abdominal pain. Negative for vomiting and diarrhea.  Genitourinary: Positive for urgency. Negative for dysuria, penile pain and testicular pain.       As per history of present illness  Musculoskeletal: Negative.   Neurological: Negative.     Allergies  Review of patient's allergies indicates no known allergies.  Home Medications   Current Outpatient Rx  Name  Route  Sig  Dispense  Refill  . losartan-hydrochlorothiazide (HYZAAR) 50-12.5 MG per tablet   Oral   Take 1 tablet by mouth daily.   30 tablet   0   . Multiple Vitamins-Minerals (MULTIVITAMINS THER. W/MINERALS) TABS   Oral   Take 1 tablet by mouth daily.         Marland Kitchen omeprazole (PRILOSEC) 20 MG capsule   Oral   Take 20 mg by mouth daily.          BP 180/92  Pulse 76  Temp(Src) 98.8 F (37.1 C) (Oral)  Resp 14  SpO2 100% Physical Exam  Nursing note and vitals reviewed. Constitutional: He is oriented to person, place, and time. He appears well-developed and well-nourished. No distress.  HENT:  Mouth/Throat: Oropharynx is clear and moist. No oropharyngeal exudate.  Eyes: Conjunctivae and EOM are normal.  Neck: Normal range of motion. Neck supple.  Cardiovascular: Normal rate, regular rhythm, normal heart sounds and intact distal pulses.   Pulmonary/Chest: Breath sounds normal. No respiratory distress. He has no wheezes. He has no rales.  Abdominal: Soft. Bowel sounds are normal. He exhibits no distension and no mass. There is no tenderness. There is no rebound and no guarding.  Genitourinary:  Prostate is one plus enlarged. No direct tenderness. No stool in the vault. Hemoccult is negative.  Musculoskeletal: He exhibits edema.  2-3+ pitting edema of the lower extremities  Lymphadenopathy:    He has no cervical adenopathy.  Neurological: He is alert and oriented to person, place, and time.  Skin: Skin is warm and dry.  Psychiatric: He has a normal mood and affect.    ED Course  Procedures (including critical care time) Labs Review Labs Reviewed  POCT I-STAT, CHEM 8 - Abnormal; Notable for the following:    Creatinine, Ser 1.40 (*)    Glucose, Bld 290 (*)    Calcium, Ion 1.28 (*)  All other components within normal limits  OCCULT BLOOD X 1 CARD TO LAB, STOOL   Imaging Review No results found.    MDM   1. T2DM (type 2 diabetes mellitus)   2. Hyperglycemia   3. Polyuria   4. Enlarged prostate without lower urinary tract symptoms (luts)   5. Bilateral lower extremity edema   6. HTN (hypertension)   7. Elevated serum creatinine    Patient has multiple diagnoses as above and will need primary care as soon as possible for management. Today I will prescribe a trial of  Flomax to see if that will help with urinary stream. Also losartan and hydrochlorothiazide 50/12.5 mg to assist with renal flow and hypertension. He will be given information on contacting her physician as well as the [facility].      Janne Napoleon, NP 03/04/13 1012

## 2013-03-05 ENCOUNTER — Emergency Department (HOSPITAL_COMMUNITY)
Admission: EM | Admit: 2013-03-05 | Discharge: 2013-03-05 | Disposition: A | Discharge status: Home / Self Care | Payer: Self-pay | Attending: Emergency Medicine | Admitting: Emergency Medicine

## 2013-03-05 ENCOUNTER — Encounter (HOSPITAL_COMMUNITY): Payer: Self-pay | Admitting: Emergency Medicine

## 2013-03-05 DIAGNOSIS — Z79899 Other long term (current) drug therapy: Secondary | ICD-10-CM | POA: Insufficient documentation

## 2013-03-05 DIAGNOSIS — E119 Type 2 diabetes mellitus without complications: Secondary | ICD-10-CM | POA: Insufficient documentation

## 2013-03-05 DIAGNOSIS — I10 Essential (primary) hypertension: Secondary | ICD-10-CM | POA: Insufficient documentation

## 2013-03-05 DIAGNOSIS — I83893 Varicose veins of bilateral lower extremities with other complications: Secondary | ICD-10-CM | POA: Insufficient documentation

## 2013-03-05 DIAGNOSIS — Z87891 Personal history of nicotine dependence: Secondary | ICD-10-CM | POA: Insufficient documentation

## 2013-03-05 DIAGNOSIS — Z9889 Other specified postprocedural states: Secondary | ICD-10-CM | POA: Insufficient documentation

## 2013-03-05 LAB — GLUCOSE, CAPILLARY: Glucose-Capillary: 132 mg/dL — ABNORMAL HIGH (ref 70–99)

## 2013-03-05 NOTE — ED Provider Notes (Signed)
CSN: [id number]     Arrival date & time 03/05/13  0908 History   First MD Initiated Contact with Patient 03/05/13 0912     Chief Complaint  Patient presents with  . Hyperglycemia  . Leg Swelling    HPI Patient presents to the emergency room today with several complaints that have been ongoing for several months to years. Patient primarily is concerned about his elevated blood sugar and polyuria. Patient states she's been having trouble with urinary frequency. He  denies any burning or fever.  He has not had polydipsia.  The patient also has noticed pitting edema of his lower extremities for several months as well. Patient does have a history of varicose veins with prior varicose vein procedure on his left lower extremity. He does notice that he has more swelling in that leg and has also noticed that is very close veins seem to be getting larger again.  Patient also incidentally mentions having trouble with pain in his right lower abdomen for years but is currently not having any complaints. He's not having any vomiting diarrhea or fevers. His appetite is fine.  The patient went to an urgent care yesterday. He was told that he had an elevated blood sugar.  Patient was also found to be hypertensive. Patient was prescribed Flomax as well as losartan hydrochlorothiazide. The patient was not started on diabetes medications as the doctor wanted him to have further testing to determine the best treatment. The patient was given a referral to come to the health and wellness center. Patient called for an appointment and has one in March. The patient was concerned that this was too far away so he decided to come to the emergency department. Nothing has changed in his symptoms since yesterday History reviewed. No pertinent past medical history. History reviewed. No pertinent past surgical history. History reviewed. No pertinent family history. History  Substance Use Topics  . Smoking status: Former Smoker     Types: Cigarettes  . Smokeless tobacco: Not on file  . Alcohol Use: No    Review of Systems  Constitutional: Negative for fever and fatigue.  Eyes: Negative for photophobia.  Respiratory: Negative for chest tightness and shortness of breath.   Neurological: Negative for dizziness.  All other systems reviewed and are negative.    Allergies  Review of patient's allergies indicates no known allergies.  Home Medications   Current Outpatient Rx  Name  Route  Sig  Dispense  Refill  . losartan-hydrochlorothiazide (HYZAAR) 50-12.5 MG per tablet   Oral   Take 1 tablet by mouth daily.   30 tablet   0   . Multiple Vitamins-Minerals (MULTIVITAMINS THER. W/MINERALS) TABS   Oral   Take 1 tablet by mouth daily.         Marland Kitchen omeprazole (PRILOSEC) 20 MG capsule   Oral   Take 20 mg by mouth daily.         . tamsulosin (FLOMAX) 0.4 MG CAPS capsule   Oral   Take 0.4 mg by mouth.          BP 145/101  Pulse 100  Temp(Src) 98.6 F (37 C) (Oral)  Resp 18  Ht 6' (1.829 m)  Wt 210 lb (95.255 kg)  BMI 28.47 kg/m2  SpO2 99% Physical Exam  Nursing note and vitals reviewed. Constitutional: He appears well-developed and well-nourished. No distress.  HENT:  Head: Normocephalic and atraumatic.  Right Ear: External ear normal.  Left Ear: External ear normal.  Eyes: Conjunctivae  are normal. Right eye exhibits no discharge. Left eye exhibits no discharge. No scleral icterus.  Neck: Neck supple. No tracheal deviation present.  Cardiovascular: Normal rate, regular rhythm and intact distal pulses.   Pulmonary/Chest: Effort normal and breath sounds normal. No stridor. No respiratory distress. He has no wheezes. He has no rales.  Abdominal: Soft. Bowel sounds are normal. He exhibits no distension. There is no tenderness. There is no rebound and no guarding.  Musculoskeletal: He exhibits edema. He exhibits no tenderness.  Varicose veins noted left lower extremity, mild pitting edema  bilateral lower extremities  Neurological: He is alert. He has normal strength. No cranial nerve deficit (no facial droop, extraocular movements intact, no slurred speech) or sensory deficit. He exhibits normal muscle tone. He displays no seizure activity. Coordination normal.  Skin: Skin is warm and dry. No rash noted.  Psychiatric: He has a normal mood and affect.    ED Course  Procedures (including critical care time) Labs Review Labs Reviewed  GLUCOSE, CAPILLARY - Abnormal; Notable for the following:    Glucose-Capillary 132 (*)    All other components within normal limits     MDM   1. HTN (hypertension)   2. Diabetes    I reviewed the patient's visit from the urgent care yesterday. The patient did have an i-STAT blood test. At that time his blood sugar was elevated at 290 he had a creatinine of 1.4.  The patient's symptoms are not different from yesterday. His fasting blood sugar this morning is 132. Patient states he had not eaten anything before coming into the emergency department. He did not feel that he requires immediate diabetic medication considering his blood sugar this morning. He may benefit from primary care evaluation including hemoglobin A1c. I recommend him to start a carbohydrate modified diet. I will give him a referral to the diabetes education center.    I explained to the patient that his medications prescribed yesterday may help with his lower extremity. swelling and his urinary frequency.  The patient may notice some improvement in a couple of weeks. Should followup with primary care Dr.  At this time there does not appear to be any evidence of an acute emergency medical condition and the patient appears stable for discharge with appropriate outpatient follow up.     Kathalene Frames, MD 03/05/13 1009

## 2013-03-05 NOTE — Discharge Instructions (Signed)
Diabetes, Eating Away From Home Sometimes, you might eat in a restaurant or have meals that are prepared by someone else. You can enjoy eating out. However, the portions in restaurants may be much larger than needed. Listed below are some ideas to help you choose foods that will keep your blood glucose (sugar) in better control.  TIPS FOR EATING OUT  Know your meal plan and how many carbohydrate servings you should have at each meal. You may wish to carry a copy of your meal plan in your purse or wallet. Learn the foods included in each food group.  Make a list of restaurants near you that offer healthy choices. Take a copy of the carry-out menus to see what they offer. Then, you can plan what you will order ahead of time.  Become familiar with serving sizes by practicing them at home using measuring cups and spoons. Once you learn to recognize portion sizes, you will be able to correctly estimate the amount of total carbohydrate you are allowed to eat at the restaurant. Ask for a takeout box if the portion is more than you should have. When your food comes, leave the amount you should have on the plate, and put the rest in the takeout box before you start eating.  Plan ahead if your mealtime will be different from usual. Check with your caregiver to find out how to time meals and medicine if you are taking insulin.  Avoid high-fat foods, such as fried foods, cream sauces, high-fat salad dressings, or any added butter or margarine.  Do not be afraid to ask questions. Ask your server about the portion size, cooking methods, ingredients and if items can be substituted. Restaurants do not list all available items on the menu. You can ask for your main entree to be prepared using skim milk, oil instead of butter or margarine, and without gravy or sauces. Ask your waiter or waitress to serve salad dressings, gravy, sauces, margarine, and sour cream on the side. You can then add the amount your meal plan  suggests.  Add more vegetables whenever possible.  Avoid items that are labeled "jumbo," "giant," "deluxe," or "supersized."  You may want to split an entre with someone and order an extra side salad.  Watch for hidden calories in foods like croutons, bacon, or cheese.  Ask your server to take away the bread basket or chips from your table.  Order a dinner salad as an appetizer. You can eat most foods served in a restaurant. Some foods are better choices than others. Breads and Starches  Recommended: All kinds of bread (wheat, rye, white, oatmeal, [place], [place], raisin), hard or soft dinner rolls, frankfurter or hamburger buns, small bagels, small corn or whole-wheat flour tortillas.  Avoid: Frosted or glazed breads, butter rolls, egg or cheese breads, croissants, sweet rolls, pastries, coffee cake, glazed or frosted doughnuts, muffins. Crackers  Recommended: Animal crackers, graham, rye, saltine, oyster, and matzoth crackers. Bread sticks, melba toast, rusks, pretzels, popcorn (without fat), zwieback toast.  Avoid: High-fat snack crackers or chips. Buttered popcorn. Cereals  Recommended: Hot and cold cereals. Whole grains such as oatmeal or shredded wheat are good choices.  Avoid: Sugar-coated or granola type cereals. Potatoes/Pasta/Rice/Beans  Recommended: Order baked, boiled, or mashed potatoes, rice or noodles without added fat, whole beans. Order gravies, butter, margarine, or sauces on the side so you can control the amount you add.  Avoid: Hash browns or fried potatoes. Potatoes, pasta, or rice prepared with cream or  cheese sauce. Potato or pasta salads prepared with large amounts of dressing. Fried beans or fried rice. Vegetables  Recommended: Order steamed, baked, boiled, or stewed vegetables without sauces or extra fat. Ask that sauce be served on the side. If vegetables are not listed on the menu, ask what is available.  Avoid: Vegetables prepared with cream,  butter, or cheese sauce. Fried vegetables. Salad Bars  Recommended: Many of the vegetables at a salad bar are considered "free." Use lemon juice, vinegar, or low-calorie salad dressing (fewer than 20 calories per serving) as "free" dressings for your salad. Look for salad bar ingredients that have no added fat or sugar such as tomatoes, lettuce, cucumbers, broccoli, carrots, onions, and mushrooms.  Avoid: Prepared salads with large amounts of dressing, such as coleslaw, caesar salad, macaroni salad, bean salad, or carrot salad. Fruit  Recommended: Eat fresh fruit or fresh fruit salad without added dressing. A salad bar often offers fresh fruit choices, but canned fruit at a restaurant is usually packed in sugar or syrup.  Avoid: Sweetened canned or frozen fruits, plain or sweetened fruit juice. Fruit salads with dressing, sour cream, or sugar added to them. Meat and Meat Substitutes  Recommended: Order broiled, baked, roasted, or grilled meat, poultry, or fish. Trim off all visible fat. Do not eat the skin of poultry. The size stated on the menu is the raw weight. Meat shrinks by  in cooking (for example, 4 oz raw equals 3 oz cooked meat).  Avoid: Deep-fat fried meat, poultry, or fish. Breaded meats. Eggs  Recommended: Order soft, hard-cooked, poached, or scrambled eggs. Omelets may be okay, depending on what ingredients are added. Egg substitutes are also a good choice.  Avoid: Fried eggs, eggs prepared with cream or cheese sauce. Milk  Recommended: Order low-fat or fat-free milk according to your meal plan. Plain, nonfat yogurt or flavored yogurt with no sugar added may be used as a substitute for milk. Soy milk may also be used.  Avoid: Milk shakes or sweetened milk beverages. Soups and Combination Foods  Recommended: Clear broth or consomm are "free" foods and may be used as an appetizer. Broth-based soups with fat removed count as a starch serving and are preferred over cream  soups. Soups made with beans or split peas may be eaten but count as a starch.  Avoid: Fatty soups, soup made with cream, cheese soup. Combination foods prepared with excessive amounts of fat or with cream or cheese sauces. Desserts and Sweets  Recommended: Ask for fresh fruit. Sponge or angel food cake without icing, ice milk, no sugar added ice cream, sherbet, or frozen yogurt may fit into your meal plan occasionally.  Avoid: Pastries, puddings, pies, cakes with icing, custard, gelatin desserts. Fats and Oils  Recommended: Choose healthy fats such as olive oil, canola oil, or tub margarine, reduced fat or fat-free sour cream, cream cheese, avocado, or nuts.  Avoid: Any fats in excess of your allowed portion. Deep-fried foods or any food with a large amount of fat. Note: Ask for all fats to be served on the side, and limit your portion sizes according to your meal plan. Document Released: 01/24/2005 Document Revised: 04/18/2011 Document Reviewed: 08/14/2008 [facility] Patient Information 2014 [place], [place].

## 2013-03-05 NOTE — ED Notes (Signed)
Pt now complains for abd pain for over 2 years now, he would like checked out.

## 2013-03-05 NOTE — ED Notes (Signed)
Pt reports recently being [facility] at [facility] and told his sugar was elevated with no diagnosis of DM, pt reports urinating q 30 mins but no polydipsia, pt reports leg edema for 2 years

## 2013-03-05 NOTE — Discharge Planning (Signed)
[id number] Felicia E, Community Liaison  Follow up appointment made with the [facility] for Thursday Feb19,2015 at 12:00p to establish primary care, patient will also be obtaining the orange card during this visit. Patient is aware of both appointments and was given my contact information for any questions or concerns.

## 2013-03-28 ENCOUNTER — Encounter: Payer: Self-pay | Admitting: Family Medicine

## 2013-03-28 ENCOUNTER — Ambulatory Visit: Payer: Self-pay | Attending: Internal Medicine | Admitting: Family Medicine

## 2013-03-28 ENCOUNTER — Ambulatory Visit: Payer: Self-pay

## 2013-03-28 ENCOUNTER — Ambulatory Visit: Payer: Self-pay | Admitting: Home Health Services

## 2013-03-28 VITALS — BP 134/85 | HR 78 | Temp 98.2°F | Wt 211.4 lb

## 2013-03-28 DIAGNOSIS — E1165 Type 2 diabetes mellitus with hyperglycemia: Secondary | ICD-10-CM

## 2013-03-28 DIAGNOSIS — R350 Frequency of micturition: Secondary | ICD-10-CM

## 2013-03-28 DIAGNOSIS — R1013 Epigastric pain: Secondary | ICD-10-CM

## 2013-03-28 DIAGNOSIS — IMO0002 Reserved for concepts with insufficient information to code with codable children: Secondary | ICD-10-CM

## 2013-03-28 DIAGNOSIS — E118 Type 2 diabetes mellitus with unspecified complications: Secondary | ICD-10-CM

## 2013-03-28 DIAGNOSIS — I10 Essential (primary) hypertension: Secondary | ICD-10-CM

## 2013-03-28 LAB — CBC WITH DIFFERENTIAL/PLATELET
BASOS PCT: 1 % (ref 0–1)
Basophils Absolute: 0 10*3/uL (ref 0.0–0.1)
EOS ABS: 0.4 10*3/uL (ref 0.0–0.7)
EOS PCT: 8 % — AB (ref 0–5)
HEMATOCRIT: 44.7 % (ref 39.0–52.0)
HEMOGLOBIN: 15.4 g/dL (ref 13.0–17.0)
Lymphocytes Relative: 23 % (ref 12–46)
Lymphs Abs: 1.1 10*3/uL (ref 0.7–4.0)
MCH: 28.5 pg (ref 26.0–34.0)
MCHC: 34.5 g/dL (ref 30.0–36.0)
MCV: 82.6 fL (ref 78.0–100.0)
MONO ABS: 0.3 10*3/uL (ref 0.1–1.0)
MONOS PCT: 6 % (ref 3–12)
Neutro Abs: 3 10*3/uL (ref 1.7–7.7)
Neutrophils Relative %: 62 % (ref 43–77)
Platelets: 265 10*3/uL (ref 150–400)
RBC: 5.41 MIL/uL (ref 4.22–5.81)
RDW: 12.8 % (ref 11.5–15.5)
WBC: 4.8 10*3/uL (ref 4.0–10.5)

## 2013-03-28 LAB — LIPID PANEL
Cholesterol: 165 mg/dL (ref 0–200)
HDL: 44 mg/dL (ref >39)
LDL Cholesterol: 112 mg/dL — ABNORMAL HIGH (ref 0–99)
TRIGLYCERIDES: 46 mg/dL (ref <150)
Total CHOL/HDL Ratio: 3.8 Ratio
VLDL: 9 mg/dL (ref 0–40)

## 2013-03-28 LAB — POCT URINALYSIS DIPSTICK
Bilirubin, UA: NEGATIVE
Glucose, UA: NEGATIVE
KETONES UA: 15
Leukocytes, UA: NEGATIVE
Nitrite, UA: NEGATIVE
PH UA: 6.5
PROTEIN UA: NEGATIVE
RBC UA: NEGATIVE
SPEC GRAV UA: 1.02
Urobilinogen, UA: 0.2

## 2013-03-28 LAB — COMPREHENSIVE METABOLIC PANEL
ALBUMIN: 4.4 g/dL (ref 3.5–5.2)
ALK PHOS: 58 U/L (ref 39–117)
ALT: 37 U/L (ref 0–53)
AST: 31 U/L (ref 0–37)
BUN: 16 mg/dL (ref 6–23)
CALCIUM: 9.3 mg/dL (ref 8.4–10.5)
CHLORIDE: 100 meq/L (ref 96–112)
CO2: 31 mEq/L (ref 19–32)
CREATININE: 1.33 mg/dL (ref 0.50–1.35)
GLUCOSE: 153 mg/dL — AB (ref 70–99)
POTASSIUM: 3.7 meq/L (ref 3.5–5.3)
Sodium: 138 mEq/L (ref 135–145)
Total Bilirubin: 0.7 mg/dL (ref 0.2–1.2)
Total Protein: 6.9 g/dL (ref 6.0–8.3)

## 2013-03-28 LAB — TSH: TSH: 0.921 u[IU]/mL (ref 0.350–4.500)

## 2013-03-28 MED ORDER — ROSUVASTATIN CALCIUM 10 MG PO TABS: 10.0000 mg | ORAL_TABLET | Freq: Every day | ORAL | Status: DC

## 2013-03-28 MED ORDER — METFORMIN HCL 500 MG PO TABS: 500.0000 mg | ORAL_TABLET | Freq: Two times a day (BID) | ORAL | Status: DC

## 2013-03-28 MED ORDER — ATORVASTATIN CALCIUM 40 MG PO TABS: 40.0000 mg | ORAL_TABLET | Freq: Every day | ORAL | Status: DC

## 2013-03-28 NOTE — Patient Instructions (Signed)

## 2013-03-28 NOTE — Progress Notes (Signed)
   Subjective:    Patient ID: Stanley Chapman, male    DOB: 01/31/1966, 48 y.o.   MRN: [id number]  HPI Pt here to establish care.   He has been seen in the ED and diagnosed with DM2. He is currently not on any meds but has been referred to the DM ed. A1c 03/18/13 was 6.7. He does c/o polyuria butu denies polydipsia. He says the polyuria feels more like he can't get the urine through, can't empty, and he has some dribbling. He has a DRE last year and was told his prostate was enlarged. He was put on flomax but isn't sure it's helping.   Pt c/o b/l LE edema but says it has improved since wearing knee high socks. No shob.   He c/o abd pain for several years, worsening. Worst after eating anything. No vomiting or diarrhea. No previous workup.    Review of Systems A 12 point review of systems is negative except as per hpi.       Objective:   Physical Exam Nursing note and vitals reviewed. Constitutional: He is oriented to person, place, and time. He  appears well-developed and well-nourished.  HENT:  Right Ear: External ear normal.  Left Ear: External ear normal.  Nose: Nose normal.  Mouth/Throat: Oropharynx is clear and moist. No oropharyngeal exudate.  Eyes: Conjunctivae are normal. Pupils are equal, round, and reactive to light.  Neck: Normal range of motion. Neck supple. No thyromegaly present.  Cardiovascular: Normal rate, regular rhythm and normal heart sounds.   Pulmonary/Chest: Effort normal and breath sounds normal.  Abdominal: Soft. Bowel sounds are normal.  no distension.  There is no rebound. Ttp epigastrum. Neg murphys.  Lymphadenopathy:    He has no cervical adenopathy.  Neurological: He is alert and oriented to person, place, and time. He has normal reflexes.  Skin: Skin is warm and dry.He has no concerning moles or skin lesions Psychiatric: He has a normal mood and affect. His behavior is normal.        Assessment & Plan:  Stanley Chapman was seen today for hospitalization  follow-up.  Diagnoses and associated orders for this visit:  Urinary frequency - Ambulatory referral to Urology - POCT urinalysis dipstick - Urine culture  Abdominal pain, epigastric - H. pylori Screen - Comprehensive metabolic panel - CBC with Differential - eval for anemia  Type II or unspecified type diabetes mellitus with unspecified complication, uncontrolled - metFORMIN (GLUCOPHAGE) 500 MG tablet; Take 1 tablet (500 mg total) by mouth 2 (two) times daily with a meal. Plan to increase as needed/tolerated - rosuvastatin (CRESTOR) 10 MG tablet; Take 1 tablet (10 mg total) by mouth daily. - Lipid Panel - Ambulatory referral to [facility] - retinal scan - on hyzaar - if no bleeding ulcer will start 81mg  asa/day - doesn't have a meter yet. Will see how this month goes, education, new eating style, new meds. Likely start meter next visit  Essential hypertension, benign - TSH  rtc 1 mo f/u, earlier if needed.

## 2013-03-30 LAB — URINE CULTURE
COLONY COUNT: NO GROWTH
Organism ID, Bacteria: NO GROWTH

## 2013-04-01 ENCOUNTER — Ambulatory Visit: Payer: Self-pay | Admitting: Home Health Services

## 2013-04-12 ENCOUNTER — Telehealth: Payer: Self-pay | Admitting: Internal Medicine

## 2013-04-12 ENCOUNTER — Ambulatory Visit: Payer: No Typology Code available for payment source | Attending: Internal Medicine

## 2013-04-12 NOTE — Telephone Encounter (Signed)
Pt is calling about the status of his lab results on 03/28/2013; pt also mentioned that he has had a reaction to his blood pressure medication

## 2013-04-13 ENCOUNTER — Emergency Department (HOSPITAL_COMMUNITY)
Admission: EM | Admit: 2013-04-13 | Discharge: 2013-04-13 | Disposition: A | Discharge status: Home / Self Care | Payer: No Typology Code available for payment source | Attending: Emergency Medicine | Admitting: Emergency Medicine

## 2013-04-13 ENCOUNTER — Encounter (HOSPITAL_COMMUNITY): Payer: Self-pay | Admitting: Emergency Medicine

## 2013-04-13 DIAGNOSIS — Y9389 Activity, other specified: Secondary | ICD-10-CM | POA: Insufficient documentation

## 2013-04-13 DIAGNOSIS — Y929 Unspecified place or not applicable: Secondary | ICD-10-CM | POA: Insufficient documentation

## 2013-04-13 DIAGNOSIS — S0502XA Injury of conjunctiva and corneal abrasion without foreign body, left eye, initial encounter: Secondary | ICD-10-CM

## 2013-04-13 DIAGNOSIS — S058X9A Other injuries of unspecified eye and orbit, initial encounter: Secondary | ICD-10-CM | POA: Insufficient documentation

## 2013-04-13 DIAGNOSIS — X58XXXA Exposure to other specified factors, initial encounter: Secondary | ICD-10-CM | POA: Insufficient documentation

## 2013-04-13 DIAGNOSIS — Z79899 Other long term (current) drug therapy: Secondary | ICD-10-CM | POA: Insufficient documentation

## 2013-04-13 DIAGNOSIS — Z87891 Personal history of nicotine dependence: Secondary | ICD-10-CM | POA: Insufficient documentation

## 2013-04-13 MED ORDER — FLUORESCEIN SODIUM 1 MG OP STRP
1.0000 | ORAL_STRIP | Freq: Once | OPHTHALMIC | Status: AC
  Administered 2013-04-13: 1 via OPHTHALMIC
  Filled 2013-04-13: qty 1

## 2013-04-13 MED ORDER — TETRACAINE HCL 0.5 % OP SOLN
2.0000 [drp] | Freq: Once | OPHTHALMIC | Status: AC
  Administered 2013-04-13: 2 [drp] via OPHTHALMIC
  Filled 2013-04-13: qty 2

## 2013-04-13 MED ORDER — HYDROCODONE-ACETAMINOPHEN 5-325 MG PO TABS: 1.0000 | ORAL_TABLET | ORAL | Status: DC | PRN

## 2013-04-13 MED ORDER — TOBRAMYCIN 0.3 % OP SOLN
2.0000 [drp] | OPHTHALMIC | Status: DC
  Administered 2013-04-13: 2 [drp] via OPHTHALMIC
  Filled 2013-04-13: qty 5
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
## 2013-04-13 NOTE — Discharge Instructions (Signed)
Corneal Abrasion  The cornea is the clear covering at the front and center of the eye. When looking at the colored portion of the eye (iris), you are looking through the cornea. This very thin tissue is made up of many layers. The surface layer is a single layer of cells (corneal epithelium) and is one of the most sensitive tissues in the body. If a scratch or injury causes the corneal epithelium to come off, it is called a corneal abrasion. If the injury extends to the tissues below the epithelium, the condition is called a corneal ulcer.  CAUSES    Scratches.   Trauma.   Foreign body in the eye.  Some people have recurrences of abrasions in the area of the original injury even after it has healed (recurrent erosion syndrome). Recurrent erosion syndrome generally improves and goes away with time.  SYMPTOMS    Eye pain.   Difficulty or inability to keep the injured eye open.   The eye becomes very sensitive to light.   Recurrent erosions tend to happen suddenly, first thing in the morning, usually after waking up and opening the eye.  DIAGNOSIS   Your health care provider can diagnose a corneal abrasion during an eye exam. Dye is usually placed in the eye using a drop or a small paper strip moistened by your tears. When the eye is examined with a special light, the abrasion shows up clearly because of the dye.  TREATMENT    Small abrasions may be treated with antibiotic drops or ointment alone.   Usually a pressure patch is specially applied. Pressure patches prevent the eye from blinking, allowing the corneal epithelium to heal. A pressure patch also reduces the amount of pain present in the eye during healing. Most corneal abrasions heal within 2 3 days with no effect on vision.  If the abrasion becomes infected and spreads to the deeper tissues of the cornea, a corneal ulcer can result. This is serious because it can cause corneal scarring. Corneal scars interfere with light passing through the cornea  and cause a loss of vision in the involved eye.  HOME CARE INSTRUCTIONS   Use medicine or ointment as directed. Only take over-the-counter or prescription medicines for pain, discomfort, or fever as directed by your health care provider.   Do not drive or operate machinery while your eye is patched. Your ability to judge distances is impaired.   If your health care provider has given you a follow-up appointment, it is very important to keep that appointment. Not keeping the appointment could result in a severe eye infection or permanent loss of vision. If there is any problem keeping the appointment, let your health care provider know.  SEEK MEDICAL CARE IF:    You have pain, light sensitivity, and a scratchy feeling in one eye or both eyes.   Your pressure patch keeps loosening up, and you can blink your eye under the patch after treatment.   Any kind of discharge develops from the eye after treatment or if the lids stick together in the morning.   You have the same symptoms in the morning as you did with the original abrasion days, weeks, or months after the abrasion healed.  MAKE SURE YOU:    Understand these instructions.   Will watch your condition.   Will get help right away if you are not doing well or get worse.  Document Released: 01/22/2000 Document Revised: 11/14/2012 Document Reviewed: 10/01/2012  [employer] Patient Information   2014 [employer].

## 2013-04-13 NOTE — ED Provider Notes (Signed)
Medical screening examination/treatment/procedure(s) were performed by non-physician practitioner and as supervising physician I was immediately available for consultation/collaboration.   EKG Interpretation None        Delice Bison Ramzi Brathwaite, DO 04/13/13 2256
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
## 2013-04-15 ENCOUNTER — Telehealth: Payer: Self-pay | Admitting: Emergency Medicine

## 2013-04-15 NOTE — Telephone Encounter (Signed)
Left message for pt to call when message received 

## 2013-04-17 ENCOUNTER — Other Ambulatory Visit: Payer: Self-pay | Admitting: Internal Medicine

## 2013-04-17 DIAGNOSIS — E1165 Type 2 diabetes mellitus with hyperglycemia: Secondary | ICD-10-CM

## 2013-04-17 DIAGNOSIS — IMO0002 Reserved for concepts with insufficient information to code with codable children: Secondary | ICD-10-CM

## 2013-04-17 DIAGNOSIS — E118 Type 2 diabetes mellitus with unspecified complications: Principal | ICD-10-CM

## 2013-04-17 MED ORDER — ROSUVASTATIN CALCIUM 10 MG PO TABS: 10.0000 mg | ORAL_TABLET | Freq: Every day | ORAL | Status: DC

## 2013-04-18 ENCOUNTER — Telehealth: Payer: Self-pay | Admitting: *Deleted

## 2013-04-18 ENCOUNTER — Ambulatory Visit: Payer: Self-pay

## 2013-04-18 NOTE — Telephone Encounter (Signed)
Left message for pt to call when received message

## 2013-04-18 NOTE — Progress Notes (Signed)
Not a pt at [facility]

## 2013-04-18 NOTE — Telephone Encounter (Signed)
Message copied by [facility], Louis Meckel on Thu Apr 18, 2013 12:35 PM ------      Message from: Doran Heater      Created: Thu Apr 18, 2013 12:31 PM       Please let pt know Labs all fine. Thanks AW ------
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
## 2013-04-22 ENCOUNTER — Other Ambulatory Visit: Payer: Self-pay | Admitting: *Deleted

## 2013-04-22 DIAGNOSIS — I10 Essential (primary) hypertension: Secondary | ICD-10-CM

## 2013-04-22 MED ORDER — LOSARTAN POTASSIUM-HCTZ 50-12.5 MG PO TABS: 1.0000 | ORAL_TABLET | Freq: Every day | ORAL | Status: DC

## 2013-04-24 ENCOUNTER — Ambulatory Visit: Payer: Self-pay | Admitting: Internal Medicine

## 2013-04-28 ENCOUNTER — Other Ambulatory Visit (HOSPITAL_COMMUNITY)
Admission: RE | Admit: 2013-04-28 | Discharge: 2013-04-28 | Disposition: A | Discharge status: Home / Self Care | Payer: No Typology Code available for payment source | Source: Ambulatory Visit | Attending: Family Medicine | Admitting: Family Medicine

## 2013-04-28 ENCOUNTER — Emergency Department (HOSPITAL_COMMUNITY)
Admission: EM | Admit: 2013-04-28 | Discharge: 2013-04-28 | Disposition: A | Discharge status: Home / Self Care | Payer: No Typology Code available for payment source | Source: Home / Self Care | Attending: Emergency Medicine | Admitting: Emergency Medicine

## 2013-04-28 ENCOUNTER — Encounter (HOSPITAL_COMMUNITY): Payer: Self-pay | Admitting: Emergency Medicine

## 2013-04-28 DIAGNOSIS — Z113 Encounter for screening for infections with a predominantly sexual mode of transmission: Secondary | ICD-10-CM | POA: Insufficient documentation

## 2013-04-28 DIAGNOSIS — R109 Unspecified abdominal pain: Secondary | ICD-10-CM

## 2013-04-28 DIAGNOSIS — G8929 Other chronic pain: Secondary | ICD-10-CM

## 2013-04-28 DIAGNOSIS — R39198 Other difficulties with micturition: Secondary | ICD-10-CM

## 2013-04-28 HISTORY — DX: Type 2 diabetes mellitus without complications: E11.9

## 2013-04-28 HISTORY — DX: Constipation, unspecified: K59.00

## 2013-04-28 HISTORY — DX: Essential (primary) hypertension: I10

## 2013-04-28 LAB — POCT URINALYSIS DIP (DEVICE)
Glucose, UA: NEGATIVE mg/dL
Hgb urine dipstick: NEGATIVE
LEUKOCYTES UA: NEGATIVE
NITRITE: NEGATIVE
PROTEIN: 30 mg/dL — AB
Specific Gravity, Urine: >=1.03 (ref 1.005–1.030)
Urobilinogen, UA: 2 mg/dL — ABNORMAL HIGH (ref 0.0–1.0)
pH: 5.5 (ref 5.0–8.0)

## 2013-04-28 LAB — POCT I-STAT, CHEM 8
BUN: 22 mg/dL (ref 6–23)
Calcium, Ion: 1.22 mmol/L (ref 1.12–1.23)
Chloride: 97 mEq/L (ref 96–112)
Creatinine, Ser: 1.4 mg/dL — ABNORMAL HIGH (ref 0.50–1.35)
GLUCOSE: 123 mg/dL — AB (ref 70–99)
HEMATOCRIT: 52 % (ref 39.0–52.0)
HEMOGLOBIN: 17.7 g/dL — AB (ref 13.0–17.0)
POTASSIUM: 4.3 meq/L (ref 3.7–5.3)
SODIUM: 140 meq/L (ref 137–147)
TCO2: 29 mmol/L (ref 0–100)

## 2013-04-28 LAB — OCCULT BLOOD, POC DEVICE: Fecal Occult Bld: NEGATIVE

## 2013-04-28 NOTE — Discharge Instructions (Signed)
You need to see the gastroenterologist for evaluation of your chronic abdominal pain and you still need to see a urologist for more thorough evaluation of your decreased urine stream and previous diagnosis of enlarged prostate. We have sent some tests that will not come back for a couple of days, you will receive a call when these get back if they are abnormal. In the meantime, you may resume your antihypertensive, I do not believe this is the cause of your decreased urine stream.

## 2013-04-28 NOTE — ED Notes (Signed)
Patient has multiple concerns.  Patient reports difficulty urinating for 2 days.  Reports urine stream is not a stream, but sprays.  Reports burning in penis.  No admission to any discharge.   Patient also complains of abdominal pain in right lower quadrant.  Patient reports this is a constant , daily event for 2 or more years.  Patient has issues with constipation.  Reports last bm was on Friday 3/20.   Patient out of losartan/hctz and atorvastatin

## 2013-04-28 NOTE — ED Provider Notes (Signed)
Medical screening examination/treatment/procedure(s) were performed by resident physician or non-physician practitioner and as supervising physician I was immediately available for consultation/collaboration.   Pauline Good MD.   Billy Fischer, MD 04/28/13 [phone]

## 2013-04-28 NOTE — ED Provider Notes (Signed)
CSN: [id number]     Arrival date & time 04/28/13  7858 History   First MD Initiated Contact with Patient 04/28/13 1139     Chief Complaint  Patient presents with  . Urinary Tract Infection  . Abdominal Pain   (Consider location/radiation/quality/duration/timing/severity/associated sxs/prior Treatment) HPI Comments: 48 year old male presents with a variety of complaints. These include decreased urine stream, small volume of urine, burning in the penis, right lower quadrant abdominal pains, and wanting to change his blood pressure medication. The abdominal pain has been present all day every day for 2 years. No weight loss. Has never had a colonoscopy. The decreased urine stream and frequent urination with small volume of urination and burning with urination has been going on for 2 days. He has been told in the past that his prostate is enlarged, he wonders if this could be the cause. he previously did not tolerate tamsulosin. Denies any low back pain or groin pain. Denies any penile discharge or risk of STDs. Finally, he thinks that he may not be tolerating his blood pressure medicine. He read him the side effects to come in if he had decreased volume of urination so he stopped it and came in. He did not try calling his primary care physician about this issue . No fevers, NVD   Past Medical History  Diagnosis Date  . Diabetes mellitus without complication   . Hypertension   . Constipation    History reviewed. No pertinent past surgical history. No family history on file. History  Substance Use Topics  . Smoking status: Former Smoker    Types: Cigarettes  . Smokeless tobacco: Not on file  . Alcohol Use: No    Review of Systems  Constitutional: Negative for fever, chills, fatigue and unexpected weight change.  HENT: Negative for sore throat.   Eyes: Negative for visual disturbance.  Respiratory: Negative for cough and shortness of breath.   Cardiovascular: Negative for chest pain,  palpitations and leg swelling.  Gastrointestinal: Positive for abdominal pain. Negative for nausea, vomiting, diarrhea, constipation, blood in stool, abdominal distention and rectal pain.  Genitourinary: Positive for urgency, frequency, decreased urine volume, difficulty urinating and penile pain. Negative for dysuria, hematuria, flank pain, penile swelling, scrotal swelling, genital sores and testicular pain.  Musculoskeletal: Negative for arthralgias, myalgias, neck pain and neck stiffness.  Skin: Negative for rash.  Neurological: Negative for dizziness, weakness and light-headedness.    Allergies  Losartan  Home Medications   Current Outpatient Rx  Name  Route  Sig  Dispense  Refill  . OVER THE COUNTER MEDICATION      Herbal laxative         . atorvastatin (LIPITOR) 40 MG tablet   Oral   Take 1 tablet (40 mg total) by mouth daily.   30 tablet   0   . HYDROcodone-acetaminophen (NORCO/VICODIN) 5-325 MG per tablet   Oral   Take 1 tablet by mouth every 4 (four) hours as needed.   10 tablet   0   . losartan-hydrochlorothiazide (HYZAAR) 50-12.5 MG per tablet   Oral   Take 1 tablet by mouth daily.   30 tablet   0   . metFORMIN (GLUCOPHAGE) 500 MG tablet   Oral   Take 1 tablet (500 mg total) by mouth 2 (two) times daily with a meal.   60 tablet   1   . Multiple Vitamins-Minerals (MULTIVITAMINS THER. W/MINERALS) TABS   Oral   Take 1 tablet by mouth daily.         Marland Kitchen  omeprazole (PRILOSEC) 20 MG capsule   Oral   Take 20 mg by mouth daily.         . rosuvastatin (CRESTOR) 10 MG tablet   Oral   Take 1 tablet (10 mg total) by mouth daily.   90 tablet   3   . tamsulosin (FLOMAX) 0.4 MG CAPS capsule   Oral   Take 0.4 mg by mouth.          BP 135/92  Pulse 103  Temp(Src) 98.6 F (37 C) (Oral)  Resp 18  SpO2 100% Physical Exam  Nursing note and vitals reviewed. Constitutional: He is oriented to person, place, and time. He appears well-developed and  well-nourished. No distress.  HENT:  Head: Normocephalic and atraumatic.  Eyes: Conjunctivae are normal. Right eye exhibits no discharge. Left eye exhibits no discharge.  Neck: Normal range of motion. No thyromegaly present.  Cardiovascular: Normal rate, regular rhythm and normal heart sounds.  Exam reveals no gallop and no friction rub.   No murmur heard. Pulmonary/Chest: Effort normal and breath sounds normal. No respiratory distress. He has no wheezes. He has no rales.  Abdominal: Soft. He exhibits no distension and no mass. There is tenderness (right lower quadrant, mild, no rebound). There is no rebound and no guarding.  Genitourinary: Rectum normal, testes normal and penis normal. Rectal exam shows no mass and anal tone normal. Guaiac negative stool. Prostate is enlarged (Mildly). Prostate is not tender. No penile tenderness.  Lymphadenopathy:    He has no cervical adenopathy.       Right: No inguinal adenopathy present.       Left: No inguinal adenopathy present.  Neurological: He is alert and oriented to person, place, and time. Coordination normal.  Skin: Skin is warm and dry. No rash noted. He is not diaphoretic.  Psychiatric: He has a normal mood and affect. Judgment normal.    ED Course  Procedures (including critical care time) Labs Review Labs Reviewed  POCT I-STAT, CHEM 8 - Abnormal; Notable for the following:    Creatinine, Ser 1.40 (*)    Glucose, Bld 123 (*)    Hemoglobin 17.7 (*)    All other components within normal limits  POCT URINALYSIS DIP (DEVICE) - Abnormal; Notable for the following:    Bilirubin Urine SMALL (*)    Ketones, ur TRACE (*)    Protein, ur 30 (*)    Urobilinogen, UA 2.0 (*)    All other components within normal limits  PSA  FREE PSA  OCCULT BLOOD, POC DEVICE  URINE CYTOLOGY ANCILLARY ONLY   Imaging Review No results found.   MDM   1. Chronic abdominal pain   2. Decreased urine stream     Referred to urology and to GI.  istat  normal, mildly increased creatinine which is chronic for him. can resume antihypertensive . No followup with PCP  [name], PA-C 04/28/13 [phone]

## 2013-04-29 LAB — FREE PSA
PSA FREE: 0.19 ng/mL
PSA, Free Pct: 33 % (ref >25)

## 2013-04-29 LAB — URINE CYTOLOGY ANCILLARY ONLY
Chlamydia: NEGATIVE
Neisseria Gonorrhea: NEGATIVE
Trichomonas: NEGATIVE

## 2013-04-29 LAB — PSA: PSA: 0.58 ng/mL (ref <=4.00)

## 2013-04-30 NOTE — ED Notes (Addendum)
PSA 0.58, PSA free 0.19, PSA free PCT 33.  GC/Chlamydia/Trich neg.  Message sent to [employer]. Stanley Chapman 04/30/2013 Zach said no further action. Pt. will f/u with PCP. 04/30/2013

## 2013-05-02 ENCOUNTER — Encounter: Payer: Self-pay | Admitting: Gastroenterology

## 2013-05-08 ENCOUNTER — Ambulatory Visit (INDEPENDENT_AMBULATORY_CARE_PROVIDER_SITE_OTHER): Payer: No Typology Code available for payment source | Admitting: Gastroenterology

## 2013-05-08 ENCOUNTER — Encounter: Payer: Self-pay | Admitting: Gastroenterology

## 2013-05-08 VITALS — BP 138/78 | HR 88 | Ht 71.0 in | Wt 201.0 lb

## 2013-05-08 DIAGNOSIS — R634 Abnormal weight loss: Secondary | ICD-10-CM

## 2013-05-08 DIAGNOSIS — R1031 Right lower quadrant pain: Secondary | ICD-10-CM

## 2013-05-08 DIAGNOSIS — Z1211 Encounter for screening for malignant neoplasm of colon: Secondary | ICD-10-CM

## 2013-05-08 MED ORDER — MOVIPREP 100 G PO SOLR: 1.0000 | Freq: Once | ORAL | Status: DC

## 2013-05-08 NOTE — Progress Notes (Signed)
reviewed and agree. 

## 2013-05-08 NOTE — Patient Instructions (Addendum)
You have been scheduled for a colonoscopy with propofol with Dr. Olevia Perches. Please follow written instructions given to you at your visit today.  Please pick up your prep kit at the pharmacy within the next 1-3 days. If you use inhalers (even only as needed), please bring them with you on the day of your procedure. Your physician has requested that you go to www.startemmi.com and enter the access code given to you at your visit today. This web site gives a general overview about your procedure. However, you should still follow specific instructions given to you by our office regarding your preparation for the procedure. ______________________________________________________________________________________________________________________________________________________________________  You have been scheduled for a CT scan of the abdomen and pelvis at [facility] ([address]---this is in the same building as Press [facility]).   You are scheduled on 05-10-2013 at 9 am. You should arrive 15 minutes prior to your appointment time for registration. Please follow the written instructions below on the day of your exam:  WARNING: IF YOU ARE ALLERGIC TO IODINE/X-RAY DYE, PLEASE NOTIFY RADIOLOGY IMMEDIATELY AT [phone]! YOU WILL BE GIVEN A 13 HOUR PREMEDICATION PREP.  1) Do not eat or drink anything after 5 am (4 hours prior to your test) 2) You have been given 2 bottles of oral contrast to drink. The solution may taste better if refrigerated, but do NOT add ice or any other liquid to this solution. Shake well before drinking.    Drink 1 bottle of contrast @ 7 am (2 hours prior to your exam)  Drink 1 bottle of contrast @ 8 am(1 hour prior to your exam)  You may take any medications as prescribed with a small amount of water except for the following: Metformin, Glucophage, Glucovance, Avandamet, Riomet, Fortamet, Actoplus Met, Janumet, Glumetza or Metaglip. The above medications must be  held the day of the exam AND 48 hours after the exam.  The purpose of you drinking the oral contrast is to aid in the visualization of your intestinal tract. The contrast solution may cause some diarrhea. Before your exam is started, you will be given a small amount of fluid to drink. Depending on your individual set of symptoms, you may also receive an intravenous injection of x-ray contrast/dye. Plan on being at [facility] for 30 minutes or long, depending on the type of exam you are having performed.  This test typically takes 30-45 minutes to complete.  If you have any questions regarding your exam or if you need to reschedule, you may call the CT department at [phone] between the hours of 8:00 am and 5:00 pm, Monday-Friday.  ________________________________________________________________________

## 2013-05-08 NOTE — Progress Notes (Signed)
05/08/2013 Stanley Chapman [id number] 1965-03-12   HISTORY OF PRESENT ILLNESS:  Patient is a 48 year old male who presents to our office today with complaints of RLQ abdominal pain.  He says that the pain has been there for years, but has been getting worse.  Sometimes it hurts more than others.  He has also noted a 15 pound weight loss since January, which has been unintentional.  Admits to seeing very occasional bright red blood with BM's.  BM's are unchanged and he does not have any significant constipation or diarrhea.  Recent CBC, TSH, and CMP were unremarkable.  Denies nausea, vomiting, fevers, or chills.   Past Medical History  Diagnosis Date  . Diabetes mellitus without complication   . Hypertension   . Constipation    Past Surgical History  Procedure Laterality Date  . Hernia repair      reports that he has quit smoking. His smoking use included Cigarettes. He smoked 0.00 packs per day. He does not have any smokeless tobacco history on file. He reports that he does not drink alcohol or use illicit drugs. family history includes Cancer in his mother; Diabetes in his mother; Hypertension in his mother. Allergies  Allergen Reactions  . Losartan       Outpatient Encounter Prescriptions as of 05/08/2013  Medication Sig  . atorvastatin (LIPITOR) 40 MG tablet Take 1 tablet (40 mg total) by mouth daily.  Marland Kitchen losartan-hydrochlorothiazide (HYZAAR) 50-12.5 MG per tablet Take 1 tablet by mouth daily.  . metFORMIN (GLUCOPHAGE) 500 MG tablet Take 1 tablet (500 mg total) by mouth 2 (two) times daily with a meal.  . Multiple Vitamins-Minerals (MULTIVITAMINS THER. W/MINERALS) TABS Take 1 tablet by mouth daily.  Marland Kitchen omeprazole (PRILOSEC) 20 MG capsule Take 20 mg by mouth daily.  Marland Kitchen OVER THE COUNTER MEDICATION Herbal laxative  . rosuvastatin (CRESTOR) 10 MG tablet Take 1 tablet (10 mg total) by mouth daily.  Marland Kitchen MOVIPREP 100 G SOLR Take 1 kit (200 g total) by mouth once.  . [DISCONTINUED]  HYDROcodone-acetaminophen (NORCO/VICODIN) 5-325 MG per tablet Take 1 tablet by mouth every 4 (four) hours as needed.  . [DISCONTINUED] tamsulosin (FLOMAX) 0.4 MG CAPS capsule Take 0.4 mg by mouth.     REVIEW OF SYSTEMS  : All other systems reviewed and negative except where noted in the History of Present Illness.   PHYSICAL EXAM: BP 138/78  Pulse 88  Ht '5\' 11"'  (1.803 m)  Wt 201 lb (91.173 kg)  BMI 28.05 kg/m2 General: Well developed male in no acute distress Head: Normocephalic and atraumatic Eyes:  Sclerae anicteric, conjunctiva pink. Ears: Normal auditory acuity  Lungs: Clear throughout to auscultation Heart: Regular rate and rhythm Abdomen: Soft, non-distended.  Normal bowel sounds.  Non-tender. Rectal:  Not performed.  Will be done at the time of colonoscopy. Musculoskeletal: Symmetrical with no gross deformities  Skin: No lesions on visible extremities Extremities: No edema  Neurological: Alert oriented x 4, grossly non-focal Psychological:  Alert and cooperative. Normal mood and affect  ASSESSMENT AND PLAN: -RLQ abdominal pain -Weight loss of 15 pounds unintentionally over the past couple of months. -Screening for colon cancer:  Needs screening prior to age 65 since he is African American.

## 2013-05-10 ENCOUNTER — Ambulatory Visit (INDEPENDENT_AMBULATORY_CARE_PROVIDER_SITE_OTHER)
Admission: RE | Admit: 2013-05-10 | Discharge: 2013-05-10 | Disposition: A | Discharge status: Home / Self Care | Payer: No Typology Code available for payment source | Source: Ambulatory Visit | Attending: Gastroenterology | Admitting: Gastroenterology

## 2013-05-10 DIAGNOSIS — R634 Abnormal weight loss: Secondary | ICD-10-CM

## 2013-05-10 DIAGNOSIS — R1031 Right lower quadrant pain: Secondary | ICD-10-CM

## 2013-05-10 MED ORDER — IOHEXOL 300 MG/ML  SOLN
100.0000 mL | Freq: Once | INTRAMUSCULAR | Status: AC | PRN
  Administered 2013-05-10: 100 mL via INTRAVENOUS

## 2013-05-13 ENCOUNTER — Ambulatory Visit: Payer: No Typology Code available for payment source | Attending: Internal Medicine | Admitting: Internal Medicine

## 2013-05-13 ENCOUNTER — Encounter: Payer: Self-pay | Admitting: Internal Medicine

## 2013-05-13 ENCOUNTER — Telehealth: Payer: Self-pay | Admitting: *Deleted

## 2013-05-13 VITALS — BP 131/88 | HR 98 | Temp 98.0°F | Resp 16

## 2013-05-13 DIAGNOSIS — I10 Essential (primary) hypertension: Secondary | ICD-10-CM | POA: Insufficient documentation

## 2013-05-13 DIAGNOSIS — H538 Other visual disturbances: Secondary | ICD-10-CM | POA: Insufficient documentation

## 2013-05-13 DIAGNOSIS — E119 Type 2 diabetes mellitus without complications: Secondary | ICD-10-CM | POA: Insufficient documentation

## 2013-05-13 DIAGNOSIS — E785 Hyperlipidemia, unspecified: Secondary | ICD-10-CM | POA: Insufficient documentation

## 2013-05-13 DIAGNOSIS — N281 Cyst of kidney, acquired: Secondary | ICD-10-CM

## 2013-05-13 LAB — POCT GLYCOSYLATED HEMOGLOBIN (HGB A1C): Hemoglobin A1C: 6.2

## 2013-05-13 LAB — GLUCOSE, POCT (MANUAL RESULT ENTRY): POC GLUCOSE: 133 mg/dL — AB (ref 70–99)

## 2013-05-13 MED ORDER — FREESTYLE SYSTEM KIT: 1.0000 | PACK | Freq: Three times a day (TID) | Status: DC

## 2013-05-13 NOTE — Telephone Encounter (Signed)
Message copied by Larina Bras on Mon May 13, 2013 10:00 AM ------      Message from: Alonza Bogus D      Created: Mon May 13, 2013  9:19 AM       Please let patient know that CT scan showed some lesions on his kidneys.  They are recommending a renal MRI with and without contrast to look at them further.  Will you please order that for him?  The results need to be sent to his PCP as well though because they will need to manage that issue further from there.  Otherwise CT scan is normal.  Proceed with colonoscopy as planned and scheduled as well.            Thank you,            Jess ------

## 2013-05-13 NOTE — Progress Notes (Signed)
Patient here for follow up Complains of numbness to his hands and feet

## 2013-05-13 NOTE — Telephone Encounter (Signed)
Patient is scheduled for MRI with and without contrast of kidneys on 05/21/13 @ 1:00 pm with a 12:25 pm arrival at [facility] (no prep needed). I have left a voicemail for patient to call back.

## 2013-05-13 NOTE — Progress Notes (Signed)
MRN: [id number] Name: Stanley Chapman  Sex: male Age: 48 y.o. DOB: 1965-12-30  Allergies: Losartan  Chief Complaint  Patient presents with  . Follow-up    HPI: Patient is 48 y.o. male who has history of diabetes hypertension hyperlipidemia, comes today for followup, denies any hypoglycemic symptoms patient requests glucometer to check sugar at home, occasionally patient gets numbness tingling in hands and feet. Patient also reported to have blurry vision.  Past Medical History  Diagnosis Date  . Diabetes mellitus without complication   . Hypertension   . Constipation     Past Surgical History  Procedure Laterality Date  . Hernia repair        Medication List       This list is accurate as of: 05/13/13  5:50 PM.  Always use your most recent med list.               atorvastatin 40 MG tablet  Commonly known as:  LIPITOR  Take 1 tablet (40 mg total) by mouth daily.     glucose monitoring kit monitoring kit  1 each by Does not apply route 4 (four) times daily - after meals and at bedtime. 1 month Diabetic Testing Supplies for QAC-QHS accuchecks.     losartan-hydrochlorothiazide 50-12.5 MG per tablet  Commonly known as:  HYZAAR  Take 1 tablet by mouth daily.     metFORMIN 500 MG tablet  Commonly known as:  GLUCOPHAGE  Take 1 tablet (500 mg total) by mouth 2 (two) times daily with a meal.     MOVIPREP 100 G Solr  Generic drug:  peg 3350 powder  Take 1 kit (200 g total) by mouth once.     multivitamins ther. w/minerals Tabs tablet  Take 1 tablet by mouth daily.     omeprazole 20 MG capsule  Commonly known as:  PRILOSEC  Take 20 mg by mouth daily.     OVER THE COUNTER MEDICATION  Herbal laxative     rosuvastatin 10 MG tablet  Commonly known as:  CRESTOR  Take 1 tablet (10 mg total) by mouth daily.        Meds ordered this encounter  Medications  . glucose monitoring kit (FREESTYLE) monitoring kit    Sig: 1 each by Does not apply route 4 (four)  times daily - after meals and at bedtime. 1 month Diabetic Testing Supplies for QAC-QHS accuchecks.    Dispense:  1 each    Refill:  1     There is no immunization history on file for this patient.  Family History  Problem Relation Age of Onset  . Hypertension Mother   . Diabetes Mother   . Cancer Mother     Not sure of type    History  Substance Use Topics  . Smoking status: Former Smoker    Types: Cigarettes  . Smokeless tobacco: Not on file  . Alcohol Use: No    Review of Systems   As noted in HPI  Filed Vitals:   05/13/13 1729  BP: 131/88  Pulse: 98  Temp: 98 F (36.7 C)  Resp: 16    Physical Exam  Physical Exam  Constitutional: No distress.  Eyes: EOM are normal. Pupils are equal, round, and reactive to light.  Cardiovascular: Normal rate and regular rhythm.   Pulmonary/Chest: Breath sounds normal. No respiratory distress. He has no wheezes. He has no rales.  Musculoskeletal: He exhibits no edema.    CBC  Component Value Date/Time   WBC 4.8 03/28/2013 1300   RBC 5.41 03/28/2013 1300   HGB 17.7* 04/28/2013 1121   HCT 52.0 04/28/2013 1121   PLT 265 03/28/2013 1300   MCV 82.6 03/28/2013 1300   LYMPHSABS 1.1 03/28/2013 1300   MONOABS 0.3 03/28/2013 1300   EOSABS 0.4 03/28/2013 1300   BASOSABS 0.0 03/28/2013 1300    CMP     Component Value Date/Time   NA 140 04/28/2013 1121   K 4.3 04/28/2013 1121   CL 97 04/28/2013 1121   CO2 31 03/28/2013 1300   GLUCOSE 123* 04/28/2013 1121   BUN 22 04/28/2013 1121   CREATININE 1.40* 04/28/2013 1121   CREATININE 1.33 03/28/2013 1300   CALCIUM 9.3 03/28/2013 1300   PROT 6.9 03/28/2013 1300   ALBUMIN 4.4 03/28/2013 1300   AST 31 03/28/2013 1300   ALT 37 03/28/2013 1300   ALKPHOS 58 03/28/2013 1300   BILITOT 0.7 03/28/2013 1300    Lab Results  Component Value Date/Time   CHOL 165 03/28/2013  1:00 PM    No components found with this basename: hga1c    Lab Results  Component Value Date/Time   AST 31 03/28/2013  1:00 PM     Assessment and Plan  DM (diabetes mellitus) - Plan: Glucose (CBG), HgB A1c, glucose monitoring kit (FREESTYLE) monitoring kit Results for orders placed in visit on 05/13/13  GLUCOSE, POCT (MANUAL RESULT ENTRY)      Result Value Ref Range   POC Glucose 133 (*) 70 - 99 mg/dl  POCT GLYCOSYLATED HEMOGLOBIN (HGB A1C)      Result Value Ref Range   Hemoglobin A1C 6.2     Diabetes is well controlled continue with metformin 500 mg twice a day.  HTN (hypertension) Continued current medication. Repeat blood chemistry on next visit  Other and unspecified hyperlipidemia Patient is on store repeat lipid panel on next visit.  Blurry vision - Plan: Ambulatory referral to Ophthalmology  Return in about 3 months (around 08/12/2013).  Lorayne Marek, MD

## 2013-05-14 DIAGNOSIS — E119 Type 2 diabetes mellitus without complications: Secondary | ICD-10-CM | POA: Insufficient documentation

## 2013-05-14 DIAGNOSIS — I10 Essential (primary) hypertension: Secondary | ICD-10-CM | POA: Insufficient documentation

## 2013-05-14 DIAGNOSIS — E785 Hyperlipidemia, unspecified: Secondary | ICD-10-CM | POA: Insufficient documentation

## 2013-05-14 NOTE — Telephone Encounter (Signed)
I have spoken to patient and have advised of CT findings as well as Jessica's recommendations.  He verbalizes understanding and has been given information about upcoming MRI.

## 2013-05-21 ENCOUNTER — Ambulatory Visit (HOSPITAL_COMMUNITY)
Admission: RE | Admit: 2013-05-21 | Discharge: 2013-05-21 | Disposition: A | Discharge status: Home / Self Care | Payer: No Typology Code available for payment source | Source: Ambulatory Visit | Attending: Gastroenterology | Admitting: Gastroenterology

## 2013-05-21 DIAGNOSIS — R109 Unspecified abdominal pain: Secondary | ICD-10-CM | POA: Insufficient documentation

## 2013-05-21 DIAGNOSIS — R634 Abnormal weight loss: Secondary | ICD-10-CM | POA: Insufficient documentation

## 2013-05-21 DIAGNOSIS — N281 Cyst of kidney, acquired: Secondary | ICD-10-CM

## 2013-05-21 MED ORDER — GADOBENATE DIMEGLUMINE 529 MG/ML IV SOLN
20.0000 mL | Freq: Once | INTRAVENOUS | Status: AC | PRN
  Administered 2013-05-21: 20 mL via INTRAVENOUS

## 2013-05-22 ENCOUNTER — Other Ambulatory Visit: Payer: Self-pay | Admitting: *Deleted

## 2013-05-22 DIAGNOSIS — I10 Essential (primary) hypertension: Secondary | ICD-10-CM

## 2013-05-22 MED ORDER — LOSARTAN POTASSIUM-HCTZ 50-12.5 MG PO TABS: 1.0000 | ORAL_TABLET | Freq: Every day | ORAL | Status: DC

## 2013-05-24 ENCOUNTER — Telehealth: Payer: Self-pay | Admitting: *Deleted

## 2013-05-24 ENCOUNTER — Telehealth: Payer: Self-pay | Admitting: Emergency Medicine

## 2013-05-24 NOTE — Telephone Encounter (Signed)
Dr. Annitta Needs this is an update of your pt Stanley Chapman  GI report secondary to abdominal pain:  Rollene Fare from [facility] office, called stating pt MRI/CT showed Bilat Renal Lesions Pt was referred to Urology and has scheduled appt next Tuesday 05/28/13 They also said, this may be something hereditary which require family screening as well for poss Renal Cell carcinoma

## 2013-05-24 NOTE — Telephone Encounter (Signed)
Patient states he sees [place]. Called [phone]. Spoke with Sharee Pimple the nurse and gave her information about CT and MRI results. She will get this to patient's provider. Told her patient is scheduled with urology on 05/28/13.

## 2013-06-05 ENCOUNTER — Ambulatory Visit (AMBULATORY_SURGERY_CENTER): Payer: Self-pay | Admitting: Internal Medicine

## 2013-06-05 ENCOUNTER — Encounter: Payer: Self-pay | Admitting: Internal Medicine

## 2013-06-05 VITALS — BP 108/59 | HR 71 | Temp 97.7°F | Resp 14 | Ht 71.0 in | Wt 201.0 lb

## 2013-06-05 DIAGNOSIS — R1031 Right lower quadrant pain: Secondary | ICD-10-CM

## 2013-06-05 DIAGNOSIS — Z1211 Encounter for screening for malignant neoplasm of colon: Secondary | ICD-10-CM

## 2013-06-05 LAB — GLUCOSE, CAPILLARY
GLUCOSE-CAPILLARY: 160 mg/dL — AB (ref 70–99)
Glucose-Capillary: 75 mg/dL (ref 70–99)
Glucose-Capillary: 102 mg/dL — ABNORMAL HIGH (ref 70–99)

## 2013-06-05 MED ORDER — SODIUM CHLORIDE 0.9 % IV SOLN: 500.0000 mL | INTRAVENOUS | Status: DC

## 2013-06-05 NOTE — Op Note (Signed)
[facility]  [address] [place], [id number]   COLONOSCOPY PROCEDURE REPORT  PATIENT: Stanley, Chapman  MR#: [id number] BIRTHDATE: [birth year] , 48  yrs. old GENDER: Male ENDOSCOPIST: Lafayette Dragon, MD REFERRED BY: Dr Lorayne Marek PROCEDURE DATE:  06/05/2013 PROCEDURE:   Colonoscopy, screening First Screening Colonoscopy - Avg.  risk and is 50 yrs.  old or older Yes.  Prior Negative Screening - Now for repeat screening. N/A  History of Adenoma - Now for follow-up colonoscopy & has been > or = to 3 yrs.  N/A  Polyps Removed Today? No.  Recommend repeat exam, <10 yrs? No. ASA CLASS:   Class II INDICATIONS:Average risk patient for colon cancer and right lower quadrant abdominal pain for several years. MEDICATIONS: MAC sedation, administered by CRNA and propofol (Diprivan) 300mg  IV  DESCRIPTION OF PROCEDURE:   After the risks benefits and alternatives of the procedure were thoroughly explained, informed consent was obtained.  A digital rectal exam revealed no abnormalities of the rectum.   The LB [id number]  endoscope was introduced through the anus and advanced to the cecum, which was identified by both the appendix and ileocecal valve. No adverse events experienced.   The quality of the prep was good, using MoviPrep  The instrument was then slowly withdrawn as the colon was fully examined.      COLON FINDINGS: Melanosis coli.  Retroflexed views revealed no abnormalities. The time to cecum=4 minutes 06 seconds.  Withdrawal time=8 minutes 13 seconds.  The scope was withdrawn and the procedure completed. COMPLICATIONS: There were no complications.  ENDOSCOPIC IMPRESSION: Melanosis coli ,nothing to account for right lower quadrant abdominal pain  RECOMMENDATIONS: high fiber diet MiraLax 9 g when necessary constipation recall colonoscopy in 10 years  eSigned:  Lafayette Dragon, MD 06/05/2013 5:08 PM   cc:

## 2013-06-05 NOTE — Progress Notes (Signed)
Report to pacu rn vss, bbs=clear 

## 2013-06-05 NOTE — Patient Instructions (Signed)
YOU HAD AN ENDOSCOPIC PROCEDURE TODAY AT THE [facility]: Refer to the procedure report that was given to you for any specific questions about what was found during the examination.  If the procedure report does not answer your questions, please call your gastroenterologist to clarify.  If you requested that your care partner not be given the details of your procedure findings, then the procedure report has been included in a sealed envelope for you to review at your convenience later.  YOU SHOULD EXPECT: Some feelings of bloating in the abdomen. Passage of more gas than usual.  Walking can help get rid of the air that was put into your GI tract during the procedure and reduce the bloating. If you had a lower endoscopy (such as a colonoscopy or flexible sigmoidoscopy) you may notice spotting of blood in your stool or on the toilet paper. If you underwent a bowel prep for your procedure, then you may not have a normal bowel movement for a few days.  DIET: Your first meal following the procedure should be a light meal and then it is ok to progress to your normal diet.  A half-sandwich or bowl of soup is an example of a good first meal.  Heavy or fried foods are harder to digest and may make you feel nauseous or bloated.  Likewise meals heavy in dairy and vegetables can cause extra gas to form and this can also increase the bloating.  Drink plenty of fluids but you should avoid alcoholic beverages for 24 hours.  ACTIVITY: Your care partner should take you home directly after the procedure.  You should plan to take it easy, moving slowly for the rest of the day.  You can resume normal activity the day after the procedure however you should NOT DRIVE or use heavy machinery for 24 hours (because of the sedation medicines used during the test).    SYMPTOMS TO REPORT IMMEDIATELY: A gastroenterologist can be reached at any hour.  During normal business hours, 8:30 AM to 5:00 PM Monday through Friday,  call [phone].  After hours and on weekends, please call the GI answering service at [phone] who will take a message and have the physician on call contact you.   Following lower endoscopy (colonoscopy or flexible sigmoidoscopy):  Excessive amounts of blood in the stool  Significant tenderness or worsening of abdominal pains  Swelling of the abdomen that is new, acute  Fever of 100F or higher    FOLLOW UP: If any biopsies were taken you will be contacted by phone or by letter within the next 1-3 weeks.  Call your gastroenterologist if you have not heard about the biopsies in 3 weeks.  Our staff will call the home number listed on your records the next business day following your procedure to check on you and address any questions or concerns that you may have at that time regarding the information given to you following your procedure. This is a courtesy call and so if there is no answer at the home number and we have not heard from you through the emergency physician on call, we will assume that you have returned to your regular daily activities without incident.  SIGNATURES/CONFIDENTIALITY: You and/or your care partner have signed paperwork which will be entered into your electronic medical record.  These signatures attest to the fact that that the information above on your After Visit Summary has been reviewed and is understood.  Full responsibility of the confidentiality   of this discharge information lies with you and/or your care-partner.  High fiber diet information given.  Use miralax as directed by Dr. Olevia Perches.

## 2013-06-06 ENCOUNTER — Telehealth: Payer: Self-pay

## 2013-06-06 NOTE — Telephone Encounter (Signed)
Left a message at [phone] for the pt to call if any questions or concerns. maw

## 2013-08-08 ENCOUNTER — Telehealth: Payer: Self-pay | Admitting: Internal Medicine

## 2013-08-08 NOTE — Telephone Encounter (Signed)
Spoke with patient and told him office policy for switching providers. Patient is asking to switch from Dr. Olevia Perches to Dr. Hilarie Fredrickson. Is this ok with you Dr. Olevia Perches?

## 2013-08-08 NOTE — Telephone Encounter (Signed)
OK to switch 

## 2013-08-08 NOTE — Telephone Encounter (Signed)
Dr. Hilarie Fredrickson, Patient asking to switch to you from Dr. Olevia Perches. Dr. Olevia Perches is ok with this. Will you accept patient?

## 2013-08-12 NOTE — Telephone Encounter (Signed)
Scheduled OV for patient on 10/09/13 at 11:00 AM.

## 2013-08-12 NOTE — Telephone Encounter (Signed)
Left a message for patient to call back. 

## 2013-08-12 NOTE — Telephone Encounter (Signed)
ok 

## 2013-08-14 ENCOUNTER — Ambulatory Visit: Payer: No Typology Code available for payment source | Attending: Internal Medicine | Admitting: Internal Medicine

## 2013-08-14 ENCOUNTER — Encounter: Payer: Self-pay | Admitting: Internal Medicine

## 2013-08-14 VITALS — BP 124/83 | HR 64 | Temp 98.2°F | Resp 16 | Wt 194.0 lb

## 2013-08-14 DIAGNOSIS — E119 Type 2 diabetes mellitus without complications: Secondary | ICD-10-CM | POA: Insufficient documentation

## 2013-08-14 DIAGNOSIS — E785 Hyperlipidemia, unspecified: Secondary | ICD-10-CM

## 2013-08-14 DIAGNOSIS — I10 Essential (primary) hypertension: Secondary | ICD-10-CM

## 2013-08-14 DIAGNOSIS — E089 Diabetes mellitus due to underlying condition without complications: Secondary | ICD-10-CM

## 2013-08-14 DIAGNOSIS — E139 Other specified diabetes mellitus without complications: Secondary | ICD-10-CM

## 2013-08-14 LAB — LIPID PANEL
Cholesterol: 127 mg/dL (ref 0–200)
HDL: 46 mg/dL (ref >39)
LDL CALC: 73 mg/dL (ref 0–99)
Total CHOL/HDL Ratio: 2.8 Ratio
Triglycerides: 42 mg/dL (ref <150)
VLDL: 8 mg/dL (ref 0–40)

## 2013-08-14 LAB — GLUCOSE, POCT (MANUAL RESULT ENTRY): POC GLUCOSE: 65 mg/dL — AB (ref 70–99)

## 2013-08-14 LAB — POCT GLYCOSYLATED HEMOGLOBIN (HGB A1C): Hemoglobin A1C: 5.7

## 2013-08-14 NOTE — Progress Notes (Signed)
MRN: [id number] Name: Stanley Chapman  Sex: male Age: 48 y.o. DOB: Aug 15, 1965  Allergies: Losartan  Chief Complaint  Patient presents with  . Follow-up    dm    HPI: Patient is 48 y.o. male who has to of diabetes hypertension, hyperlipidemia comes today for followup patient reported to have hypoglycemic symptoms and his blood sugar drops 70s, he has been compliant in taking his medications his blood pressure is well controlled. Patient denies any chest pain or shortness of breath.  Past Medical History  Diagnosis Date  . Diabetes mellitus without complication   . Hypertension   . Constipation     Past Surgical History  Procedure Laterality Date  . Hernia repair        Medication List       This list is accurate as of: 08/14/13  9:56 AM.  Always use your most recent med list.               glucose monitoring kit monitoring kit  1 each by Does not apply route 4 (four) times daily - after meals and at bedtime. 1 month Diabetic Testing Supplies for QAC-QHS accuchecks.     losartan-hydrochlorothiazide 50-12.5 MG per tablet  Commonly known as:  HYZAAR  Take 1 tablet by mouth daily.     metFORMIN 500 MG tablet  Commonly known as:  GLUCOPHAGE  Take 1 tablet (500 mg total) by mouth 2 (two) times daily with a meal.     multivitamins ther. w/minerals Tabs tablet  Take 1 tablet by mouth daily.     omeprazole 20 MG capsule  Commonly known as:  PRILOSEC  Take 20 mg by mouth daily.     OVER THE COUNTER MEDICATION  Herbal laxative     rosuvastatin 10 MG tablet  Commonly known as:  CRESTOR  Take 1 tablet (10 mg total) by mouth daily.        No orders of the defined types were placed in this encounter.     There is no immunization history on file for this patient.  Family History  Problem Relation Age of Onset  . Hypertension Mother   . Diabetes Mother   . Cancer Mother     Not sure of type    History  Substance Use Topics  . Smoking status: Former  Smoker    Types: Cigarettes  . Smokeless tobacco: Not on file  . Alcohol Use: No    Review of Systems   As noted in HPI  Filed Vitals:   08/14/13 0937  BP: 124/83  Pulse: 64  Temp: 98.2 F (36.8 C)  Resp: 16    Physical Exam  Physical Exam  Constitutional: No distress.  Eyes: EOM are normal. Pupils are equal, round, and reactive to light.  Cardiovascular: Normal rate and regular rhythm.   Pulmonary/Chest: Breath sounds normal. No respiratory distress. He has no wheezes. He has no rales.  Musculoskeletal: He exhibits no edema.    CBC    Component Value Date/Time   WBC 4.8 03/28/2013 1300   RBC 5.41 03/28/2013 1300   HGB 17.7* 04/28/2013 1121   HCT 52.0 04/28/2013 1121   PLT 265 03/28/2013 1300   MCV 82.6 03/28/2013 1300   LYMPHSABS 1.1 03/28/2013 1300   MONOABS 0.3 03/28/2013 1300   EOSABS 0.4 03/28/2013 1300   BASOSABS 0.0 03/28/2013 1300    CMP     Component Value Date/Time   NA 140 04/28/2013 1121   K  4.3 04/28/2013 1121   CL 97 04/28/2013 1121   CO2 31 03/28/2013 1300   GLUCOSE 123* 04/28/2013 1121   BUN 22 04/28/2013 1121   CREATININE 1.40* 04/28/2013 1121   CREATININE 1.33 03/28/2013 1300   CALCIUM 9.3 03/28/2013 1300   PROT 6.9 03/28/2013 1300   ALBUMIN 4.4 03/28/2013 1300   AST 31 03/28/2013 1300   ALT 37 03/28/2013 1300   ALKPHOS 58 03/28/2013 1300   BILITOT 0.7 03/28/2013 1300    Lab Results  Component Value Date/Time   CHOL 165 03/28/2013  1:00 PM    No components found with this basename: hga1c    Lab Results  Component Value Date/Time   AST 31 03/28/2013  1:00 PM    Assessment and Plan  Diabetes mellitus due to underlying condition without complications - Plan:  Results for orders placed in visit on 08/14/13  GLUCOSE, POCT (MANUAL RESULT ENTRY)      Result Value Ref Range   POC Glucose 65 (*) 70 - 99 mg/dl  POCT GLYCOSYLATED HEMOGLOBIN (HGB A1C)      Result Value Ref Range   Hemoglobin A1C 5.7      Diabetes is Too tightly controlled patient  reported CBG in 70s  Will reduce the dose of metformin to 500 mg daily . Will repeat A1c on the next visit.  Other and unspecified hyperlipidemia - Plan: Patient is on Crestor will repeat her Lipid panel  Essential hypertension Blood pressure is well controlled continue with losartan/hydrochlorothiazide. Will check blood chemistry on the next visit.    Return in about 3 months (around 11/14/2013) for diabetes, hypertension, hyperipidemia.  Lorayne Marek, MD

## 2013-08-14 NOTE — Progress Notes (Signed)
Patient here for follow up on his diabetes States his blood sugars in the am have Been running in the 70's

## 2013-08-15 ENCOUNTER — Telehealth: Payer: Self-pay

## 2013-08-15 ENCOUNTER — Telehealth: Payer: Self-pay | Admitting: *Deleted

## 2013-08-15 NOTE — Telephone Encounter (Signed)
Patient returning call. Informed patient his cholesterol is improved continue with current medication per Dr. Annitta Needs.

## 2013-08-15 NOTE — Telephone Encounter (Signed)
Message copied by Dorothe Pea on Thu Aug 15, 2013 11:15 AM ------      Message from: Lorayne Marek      Created: Thu Aug 15, 2013 10:41 AM       Call and let the patient know that his cholesterol is improved continue with current medication. ------

## 2013-08-15 NOTE — Telephone Encounter (Signed)
Left message

## 2013-08-15 NOTE — Telephone Encounter (Signed)
Patient not available Message left on voice mail to return our call 

## 2013-08-15 NOTE — Telephone Encounter (Signed)
Message copied by Joan Mayans on Thu Aug 15, 2013 10:43 AM ------      Message from: Lorayne Marek      Created: Thu Aug 15, 2013 10:41 AM       Call and let the patient know that his cholesterol is improved continue with current medication. ------

## 2013-10-09 ENCOUNTER — Encounter: Payer: Self-pay | Admitting: Internal Medicine

## 2013-10-09 ENCOUNTER — Ambulatory Visit (INDEPENDENT_AMBULATORY_CARE_PROVIDER_SITE_OTHER): Payer: No Typology Code available for payment source | Admitting: Internal Medicine

## 2013-10-09 VITALS — BP 118/78 | HR 74 | Ht 71.0 in | Wt 195.2 lb

## 2013-10-09 DIAGNOSIS — R1030 Lower abdominal pain, unspecified: Secondary | ICD-10-CM

## 2013-10-09 DIAGNOSIS — N289 Disorder of kidney and ureter, unspecified: Secondary | ICD-10-CM

## 2013-10-09 DIAGNOSIS — K5900 Constipation, unspecified: Secondary | ICD-10-CM

## 2013-10-09 DIAGNOSIS — K5909 Other constipation: Secondary | ICD-10-CM

## 2013-10-09 DIAGNOSIS — N2889 Other specified disorders of kidney and ureter: Secondary | ICD-10-CM

## 2013-10-09 DIAGNOSIS — R109 Unspecified abdominal pain: Secondary | ICD-10-CM

## 2013-10-09 MED ORDER — LINACLOTIDE 145 MCG PO CAPS: 145.0000 ug | ORAL_CAPSULE | Freq: Every day | ORAL | Status: DC

## 2013-10-09 NOTE — Patient Instructions (Signed)
We have sent Linzess to your pharmacy  Follow up on 12-23-2013 at 1:30pm

## 2013-10-09 NOTE — Progress Notes (Signed)
Patient ID: Stanley Chapman, male   DOB: Jun 25, 1965, 48 y.o.   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 He was previously seen and managed by Dr. Olevia Perches that this is his first visit with me. He reports he says she's had constipation for many years. With this he has lower, primarily right lower abdominal pain. This is much worse when he is constipated and better after bowel movement. He was using MiraLax but was not feeling complete evacuation. He also tried Dulcolax but this causes cramping and worsening in his pain. In the past he has used other herbal laxatives. He denies nausea or vomiting. Appetite has been good. No recent blood in his stool or melena. He is being seen by urology to evaluate bilateral renal lesions and cancer has not been completely excluded. He has followup with them soon.  He had colonoscopy that was performed by Dr. Olevia Perches in April 2015 which showed melanosis coli but was otherwise normal. No polyps or tumors and nothing was found to account for right lower abdominal pain  Past Medical History  Diagnosis Date  . Diabetes mellitus without complication   . Hypertension   . Constipation     Past Surgical History  Procedure Laterality Date  . Hernia repair      Outpatient Prescriptions Prior to Visit  Medication Sig Dispense Refill  . glucose monitoring kit (FREESTYLE) monitoring kit 1 each by Does not apply route 4 (four) times daily - after meals and at bedtime. 1 month Diabetic Testing Supplies for QAC-QHS accuchecks.  1 each  1  . losartan-hydrochlorothiazide (HYZAAR) 50-12.5 MG per tablet Take 1 tablet by mouth daily.  30 tablet  0  . metFORMIN (GLUCOPHAGE) 500 MG tablet Take 1 tablet (500 mg total) by mouth 2 (two) times daily with a meal.  60  tablet  1  . Multiple Vitamins-Minerals (MULTIVITAMINS THER. W/MINERALS) TABS Take 1 tablet by mouth daily.      Marland Kitchen omeprazole (PRILOSEC) 20 MG capsule Take 20 mg by mouth daily.      Marland Kitchen OVER THE COUNTER MEDICATION Herbal laxative      . rosuvastatin (CRESTOR) 10 MG tablet Take 1 tablet (10 mg total) by mouth daily.  90 tablet  3   No facility-administered medications prior to visit.    Allergies  Allergen Reactions  . Losartan     Family History  Problem Relation Age of Onset  . Hypertension Mother   . Diabetes Mother   . Cancer Mother     Not sure of type    History  Substance Use Topics  . Smoking status: Former Smoker    Types: Cigarettes  . Smokeless tobacco: Not on file  . Alcohol Use: No    ROS: As per history of present illness, otherwise negative  BP 118/78  Pulse 74  Ht _0  (1.803 m)  Wt 195 lb 3.2 oz (88.542 kg)  BMI 27.24 kg/m2 Constitutional: Well-developed and well-nourished. No distress. HEENT: Normocephalic and atraumatic. Oropharynx is clear and moist. No oropharyngeal exudate. Conjunctivae are normal.  No scleral icterus. Neck: Neck supple. Trachea midline. Cardiovascular: Normal rate, regular rhythm and intact distal pulses. No M/R/G Pulmonary/chest: Effort normal and breath sounds normal. No wheezing, rales or rhonchi. Abdominal: Soft, nontender, nondistended. Bowel sounds active throughout. There are no masses palpable.  No hepatosplenomegaly. Extremities: no clubbing, cyanosis, or edema Lymphadenopathy: No cervical adenopathy noted. Neurological: Alert and oriented to person place and time. Skin: Skin is warm and dry. No rashes noted. Psychiatric: Normal mood and affect. Behavior is normal.  RELEVANT LABS AND IMAGING: CBC    Component Value Date/Time   WBC 4.8 03/28/2013 1300   RBC 5.41 03/28/2013 1300   HGB 17.7* 04/28/2013 1121   HCT 52.0 04/28/2013 1121   PLT 265 03/28/2013 1300   MCV 82.6 03/28/2013 1300   MCH 28.5 03/28/2013 1300   MCHC  34.5 03/28/2013 1300   RDW 12.8 03/28/2013 1300   LYMPHSABS 1.1 03/28/2013 1300   MONOABS 0.3 03/28/2013 1300   EOSABS 0.4 03/28/2013 1300   BASOSABS 0.0 03/28/2013 1300    CMP     Component Value Date/Time   NA 140 04/28/2013 1121   K 4.3 04/28/2013 1121   CL 97 04/28/2013 1121   CO2 31 03/28/2013 1300   GLUCOSE 123* 04/28/2013 1121   BUN 22 04/28/2013 1121   CREATININE 1.40* 04/28/2013 1121   CREATININE 1.33 03/28/2013 1300   CALCIUM 9.3 03/28/2013 1300   PROT 6.9 03/28/2013 1300   ALBUMIN 4.4 03/28/2013 1300   AST 31 03/28/2013 1300   ALT 37 03/28/2013 1300   ALKPHOS 58 03/28/2013 1300   BILITOT 0.7 03/28/2013 1300   CLINICAL DATA:  Chronic abdominal pain with weight loss. Recent CT demonstrated several new indeterminate renal lesions bilaterally.   EXAM: MRI ABDOMEN WITHOUT AND WITH CONTRAST   TECHNIQUE: Multiplanar multisequence MR imaging of the abdomen was performed both before and after the administration of intravenous contrast.   CONTRAST:  42m MULTIHANCE GADOBENATE DIMEGLUMINE 529 MG/ML IV SOLN   COMPARISON:  Abdominal CT 05/10/2013 and 01/14/2003.   FINDINGS: Corresponding with the recent CT are several new renal lesions bilaterally. All of these lesions have similar imaging characteristics with low T2 signal, non visibility on T1 weighted images prior to contrast administration, absent early enhancement following contrast but low level delayed contrast enhancement. There are least 4 lesions in the right kidney, the largest in the lower pole measuring 1.2 cm. There are 2 lesions in the left kidney, the largest in the posterior infrahilar lip measuring 1.7 cm. No robustly enhancing lesions are identified.   There is no hydronephrosis, retroperitoneal adenopathy or adrenal lesion. No abnormal intraspinal enhancement is seen.   The liver, gallbladder, pancreas and spleen demonstrate no suspicious findings.   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 Individuals with this syndrome are at increased risk of type 1 papillary renal cell carcinoma. Because of the genetic nature of this, family screening may be warranted. For this individual, urology consultation and MR follow up in 6-12 months are recommended.     Electronically Signed   By: BCamie PatienceM.D.   On: 05/21/2013 15:13 CLINICAL DATA:  Chronic right lower quadrant abdominal pain with weight loss over several years.   EXAM: CT ABDOMEN AND PELVIS WITH CONTRAST   TECHNIQUE: Multidetector CT imaging of the abdomen and pelvis was performed using the standard protocol following bolus administration of intravenous contrast.   CONTRAST:  1070mOMNIPAQUE IOHEXOL 300 MG/ML  SOLN   COMPARISON:  CT ABD  W/CM dated 01/14/2003; CT PELVIS W/CM dated 01/14/2003   FINDINGS: Small subpleural nodule at the left lung base on image number 6 is stable. The lung bases are otherwise clear. There is no pleural or pericardial effusion.   The liver, spleen, gallbladder, pancreas, biliary system and adrenal glands appear normal.   There has been interval development of several hypodense renal lesions bilaterally. In the right kidney, there are lesions in the lower pole measuring 9 mm on image 27 and 13 mm on image 28. There is an 8 mm lesion in the upper pole on image 12. In the left kidney, there is an 8 mm lesion superiorly on image 20 and a 13 mm lesion within the anterior infrahilar lip on image 30. All of these lesions measure higher than water density and are not simple cysts. There is no hydronephrosis. There is no retroperitoneal  lymphadenopathy. The renal veins and IVC appear unremarkable.   The stomach, small bowel and appendix appear normal. There is stool throughout the colon, but no evidence of surrounding inflammation or obstruction. There is a small umbilical hernia containing only fat. No significant vascular findings are demonstrated. The prostate gland is mildly enlarged with central protrusion into the bladder base. The bladder otherwise appears normal.   There is a convex right lumbar scoliosis. No worrisome osseous findings are demonstrated.   IMPRESSION: 1. Since 2004, there has been interval development of several hypodense renal lesions bilaterally. These are incompletely characterized by this contrasted examination but do not represent simple cysts. Solid lesions cannot be excluded. Further evaluation with renal MRI without and with contrast recommended. 2. No other significant findings or other evidence of malignancy. 3. Small umbilical hernia and mild central prostatic nodularity noted. 4. These results will be called to the ordering clinician or representative by the Radiologist Assistant, and communication documented in the PACS Dashboard.     Electronically Signed   By: Camie Patience M.D.   On: 05/10/2013 09:36  MRI scan was reviewed with patient today including the images as he asked to see the bilateral kidney lesions.  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 Chronic constipation/right lower abdominal pain -- pain is better after bowel movement and worse with constipation. This likely relates to his chronic constipation and possibly a component of IBS. Colonoscopy was unrevealing and he is not anemic. I  will discontinue over-the-counter laxatives and try Linzess 145 mcg daily. We discussed possible side effects. Hopefully this will help with more complete evacuation, more regular bowel habits, and improved abdominal pain. I will see him back in 12 weeks to assess his symptoms  2. bilateral renal lesions -- urology followup as scheduled

## 2013-10-17 ENCOUNTER — Other Ambulatory Visit: Payer: Self-pay | Admitting: Internal Medicine

## 2013-10-17 ENCOUNTER — Ambulatory Visit: Payer: Self-pay | Attending: Internal Medicine

## 2013-11-01 ENCOUNTER — Other Ambulatory Visit (HOSPITAL_COMMUNITY): Payer: Self-pay | Admitting: Urology

## 2013-11-01 DIAGNOSIS — N2889 Other specified disorders of kidney and ureter: Secondary | ICD-10-CM

## 2013-11-11 ENCOUNTER — Ambulatory Visit: Payer: Self-pay | Attending: Internal Medicine

## 2013-11-11 ENCOUNTER — Other Ambulatory Visit: Payer: Self-pay | Admitting: Internal Medicine

## 2013-11-29 ENCOUNTER — Ambulatory Visit (HOSPITAL_COMMUNITY)
Admission: RE | Admit: 2013-11-29 | Discharge: 2013-11-29 | Disposition: A | Discharge status: Home / Self Care | Payer: Self-pay | Source: Ambulatory Visit | Attending: Urology | Admitting: Urology

## 2013-11-29 DIAGNOSIS — N289 Disorder of kidney and ureter, unspecified: Secondary | ICD-10-CM | POA: Insufficient documentation

## 2013-11-29 DIAGNOSIS — N2889 Other specified disorders of kidney and ureter: Secondary | ICD-10-CM

## 2013-11-29 LAB — CREATININE, SERUM
Creatinine, Ser: 1.41 mg/dL — ABNORMAL HIGH (ref 0.50–1.35)
GFR calc non Af Amer: 58 mL/min — ABNORMAL LOW (ref >90)
GFR, EST AFRICAN AMERICAN: 67 mL/min — AB (ref >90)

## 2013-11-29 MED ORDER — GADOBENATE DIMEGLUMINE 529 MG/ML IV SOLN
20.0000 mL | Freq: Once | INTRAVENOUS | Status: AC | PRN
Start: 2013-11-29 — End: 2013-11-29
  Administered 2013-11-29: 18 mL via INTRAVENOUS

## 2013-12-20 ENCOUNTER — Encounter: Payer: Self-pay | Admitting: *Deleted

## 2013-12-23 ENCOUNTER — Ambulatory Visit: Payer: Self-pay | Admitting: Internal Medicine

## 2013-12-24 ENCOUNTER — Encounter: Payer: Self-pay | Admitting: Internal Medicine

## 2013-12-24 NOTE — Progress Notes (Signed)
The patient's chart has been reviewed by Dr. Hilarie Fredrickson  and the recommendations are noted below.  Follow-up advised. Contact patient and schedule visit for next available date.  Outcome of communication with the patient:  Called phone number. No answer. I will send letter.

## 2014-03-03 ENCOUNTER — Encounter: Payer: Self-pay | Admitting: Internal Medicine

## 2014-03-03 ENCOUNTER — Ambulatory Visit (INDEPENDENT_AMBULATORY_CARE_PROVIDER_SITE_OTHER): Payer: Self-pay | Admitting: Internal Medicine

## 2014-03-03 VITALS — BP 120/74 | HR 72 | Ht 71.0 in | Wt 190.0 lb

## 2014-03-03 DIAGNOSIS — K5909 Other constipation: Secondary | ICD-10-CM

## 2014-03-03 DIAGNOSIS — K5900 Constipation, unspecified: Secondary | ICD-10-CM

## 2014-03-03 DIAGNOSIS — K589 Irritable bowel syndrome without diarrhea: Secondary | ICD-10-CM

## 2014-03-03 MED ORDER — LINACLOTIDE 145 MCG PO CAPS: 145.0000 ug | ORAL_CAPSULE | Freq: Every day | ORAL | Status: DC

## 2014-03-03 NOTE — Progress Notes (Signed)
   Subjective:    Patient ID: Stanley Chapman, male    DOB: 30-Nov-1965, 49 y.o.   MRN: [id number]  HPI Tallie Terriquez is a 49 yo with PMH of chronic constipation with right lower abdominal pain who is seen in follow-up. He was last seen on 10/09/2013 and started on Linzess 145 g daily. He reports this medication has helped him dramatically. He is having one to 2 bowel movements each morning and feels complete evacuation. With this he has had resolution of his right lower quadrant abdominal pain. No blood in his stool or melena. Good appetite. No early satiety. No trouble swallowing. No hepatobiliary complaints. He reports his right lower quadrant discomfort returned when he missed his Linzess for several days in a row, though once he started it back pain resolved.  Review of Systems As per history of present illness, otherwise negative  Current Medications, Allergies, Past Medical History, Past Surgical History, Family History and Social History were reviewed in [employer] [employer] record.     Objective:   Physical Exam BP 120/74 mmHg  Pulse 72  Ht 5\' 11"  (1.803 m)  Wt 190 lb (86.183 kg)  BMI 26.51 kg/m2 Constitutional: Well-developed and well-nourished. No distress. HEENT: Normocephalic and atraumatic.Conjunctivae are normal.  No scleral icterus. Neck: Neck supple. Trachea midline. Cardiovascular: Normal rate, regular rhythm and intact distal pulses. No M/R/G Pulmonary/chest: Effort normal and breath sounds normal. No wheezing, rales or rhonchi. Abdominal: Soft, nontender, nondistended. Bowel sounds active throughout. Small nontender umbilical hernia  Extremities: no clubbing, cyanosis, or edema Neurological: Alert and oriented to person place and time. Skin: Skin is warm and dry. No rashes noted. Psychiatric: Normal mood and affect. Behavior is normal.  CBC    Component Value Date/Time   WBC 4.8 03/28/2013 1300   RBC 5.41 03/28/2013 1300   HGB 17.7* 04/28/2013  1121   HCT 52.0 04/28/2013 1121   PLT 265 03/28/2013 1300   MCV 82.6 03/28/2013 1300   MCH 28.5 03/28/2013 1300   MCHC 34.5 03/28/2013 1300   RDW 12.8 03/28/2013 1300   LYMPHSABS 1.1 03/28/2013 1300   MONOABS 0.3 03/28/2013 1300   EOSABS 0.4 03/28/2013 1300   BASOSABS 0.0 03/28/2013 1300   Colonoscopy - Dr. Olevia Perches April 2015 -- melanosis coli but otherwise normal. No polyps, inflammation or tumors  Prior CT abd/pelvis reviewed    Assessment & Plan:  49 yo with PMH of chronic constipation with right lower abdominal pain who is seen in follow-up  1. CIC with RLQ abd pain -- he has a chronic constipation and likely irritable bowel with associated right lower abdominal pain. Constipation and lower abdominal pain have completely resolved on Linzess. Continue Linzess 145 g daily. Follow-up in one year. I asked that he notify me should the medication lose efficacy or he develop recurrent abdominal pain. He voices understanding  2. History of renal lesions -urology follow-up recommended  -

## 2014-03-03 NOTE — Patient Instructions (Addendum)
We have sent the following medications to your pharmacy for you to pick up at your convenience: Linzess   Please follow up in 1 year  CC: Deepak Advaani

## 2014-05-06 ENCOUNTER — Telehealth: Payer: Self-pay | Admitting: Internal Medicine

## 2014-05-06 MED ORDER — LINACLOTIDE 290 MCG PO CAPS
290.0000 ug | ORAL_CAPSULE | Freq: Every day | ORAL | Status: DC
Start: 2014-05-06 — End: 2015-05-18

## 2014-05-06 NOTE — Telephone Encounter (Signed)
Per Alonza Bogus, PA-C (in Dr Pyrtle's absence), patient may have increase Linzess dose to 290 mcg but she would not authorize two tablets daily dosing of Linzess 145 mcg. Patient has been advised of this and verbalizes understanding that we will send Linzess 290 mcg to his pharmacy in place of the 145 mcg dosage.

## 2014-06-23 ENCOUNTER — Other Ambulatory Visit: Payer: Self-pay | Admitting: Internal Medicine

## 2014-06-25 ENCOUNTER — Other Ambulatory Visit: Payer: Self-pay

## 2014-06-25 DIAGNOSIS — E139 Other specified diabetes mellitus without complications: Secondary | ICD-10-CM

## 2014-06-25 MED ORDER — METFORMIN HCL 500 MG PO TABS: 500.0000 mg | ORAL_TABLET | Freq: Two times a day (BID) | ORAL | Status: DC

## 2014-07-02 ENCOUNTER — Encounter: Payer: Self-pay | Admitting: Internal Medicine

## 2014-07-02 ENCOUNTER — Other Ambulatory Visit: Payer: Self-pay | Admitting: Internal Medicine

## 2014-07-02 ENCOUNTER — Ambulatory Visit: Payer: Self-pay | Attending: Internal Medicine

## 2014-07-02 ENCOUNTER — Ambulatory Visit: Payer: Self-pay | Attending: Internal Medicine | Admitting: Internal Medicine

## 2014-07-02 VITALS — BP 130/70 | HR 95 | Temp 98.0°F | Resp 16 | Wt 202.0 lb

## 2014-07-02 DIAGNOSIS — Z79899 Other long term (current) drug therapy: Secondary | ICD-10-CM | POA: Insufficient documentation

## 2014-07-02 DIAGNOSIS — E7800 Pure hypercholesterolemia, unspecified: Secondary | ICD-10-CM

## 2014-07-02 DIAGNOSIS — I10 Essential (primary) hypertension: Secondary | ICD-10-CM | POA: Insufficient documentation

## 2014-07-02 DIAGNOSIS — E119 Type 2 diabetes mellitus without complications: Secondary | ICD-10-CM | POA: Insufficient documentation

## 2014-07-02 DIAGNOSIS — E139 Other specified diabetes mellitus without complications: Secondary | ICD-10-CM

## 2014-07-02 DIAGNOSIS — Z87891 Personal history of nicotine dependence: Secondary | ICD-10-CM | POA: Insufficient documentation

## 2014-07-02 DIAGNOSIS — K219 Gastro-esophageal reflux disease without esophagitis: Secondary | ICD-10-CM | POA: Insufficient documentation

## 2014-07-02 LAB — POCT GLYCOSYLATED HEMOGLOBIN (HGB A1C): Hemoglobin A1C: 6.1

## 2014-07-02 LAB — COMPLETE METABOLIC PANEL WITH GFR
ALK PHOS: 64 U/L (ref 39–117)
ALT: 29 U/L (ref 0–53)
AST: 21 U/L (ref 0–37)
Albumin: 4 g/dL (ref 3.5–5.2)
BUN: 23 mg/dL (ref 6–23)
CO2: 29 mEq/L (ref 19–32)
Calcium: 9.5 mg/dL (ref 8.4–10.5)
Chloride: 99 mEq/L (ref 96–112)
Creat: 1.22 mg/dL (ref 0.50–1.35)
GFR, Est African American: 81 mL/min
GFR, Est Non African American: 70 mL/min
GLUCOSE: 124 mg/dL — AB (ref 70–99)
POTASSIUM: 4.1 meq/L (ref 3.5–5.3)
SODIUM: 133 meq/L — AB (ref 135–145)
Total Bilirubin: 0.4 mg/dL (ref 0.2–1.2)
Total Protein: 7 g/dL (ref 6.0–8.3)

## 2014-07-02 LAB — LIPID PANEL
CHOL/HDL RATIO: 3.5 ratio
Cholesterol: 166 mg/dL (ref 0–200)
HDL: 47 mg/dL (ref >=40)
LDL CALC: 109 mg/dL — AB (ref 0–99)
TRIGLYCERIDES: 51 mg/dL (ref <150)
VLDL: 10 mg/dL (ref 0–40)

## 2014-07-02 MED ORDER — LOSARTAN POTASSIUM-HCTZ 50-12.5 MG PO TABS: 1.0000 | ORAL_TABLET | Freq: Every day | ORAL | Status: DC

## 2014-07-02 MED ORDER — OMEPRAZOLE 20 MG PO CPDR: 20.0000 mg | DELAYED_RELEASE_CAPSULE | Freq: Every day | ORAL | Status: DC

## 2014-07-02 MED ORDER — METFORMIN HCL 500 MG PO TABS: 500.0000 mg | ORAL_TABLET | Freq: Every day | ORAL | Status: DC

## 2014-07-02 MED ORDER — ROSUVASTATIN CALCIUM 10 MG PO TABS: 10.0000 mg | ORAL_TABLET | Freq: Every day | ORAL | Status: DC

## 2014-07-02 NOTE — Patient Instructions (Signed)
Diabetes Mellitus and Food It is important for you to manage your blood sugar (glucose) level. Your blood glucose level can be greatly affected by what you eat. Eating healthier foods in the appropriate amounts throughout the day at about the same time each day will help you control your blood glucose level. It can also help slow or prevent worsening of your diabetes mellitus. Healthy eating may even help you improve the level of your blood pressure and reach or maintain a healthy weight.  HOW CAN FOOD AFFECT ME? Carbohydrates Carbohydrates affect your blood glucose level more than any other type of food. Your dietitian will help you determine how many carbohydrates to eat at each meal and teach you how to count carbohydrates. Counting carbohydrates is important to keep your blood glucose at a healthy level, especially if you are using insulin or taking certain medicines for diabetes mellitus. Alcohol Alcohol can cause sudden decreases in blood glucose (hypoglycemia), especially if you use insulin or take certain medicines for diabetes mellitus. Hypoglycemia can be a life-threatening condition. Symptoms of hypoglycemia (sleepiness, dizziness, and disorientation) are similar to symptoms of having too much alcohol.  If your health care provider has given you approval to drink alcohol, do so in moderation and use the following guidelines:  Women should not have more than one drink per day, and men should not have more than two drinks per day. One drink is equal to:  12 oz of beer.  5 oz of wine.  1 oz of hard liquor.  Do not drink on an empty stomach.  Keep yourself hydrated. Have water, diet soda, or unsweetened iced tea.  Regular soda, juice, and other mixers might contain a lot of carbohydrates and should be counted. WHAT FOODS ARE NOT RECOMMENDED? As you make food choices, it is important to remember that all foods are not the same. Some foods have fewer nutrients per serving than other  foods, even though they might have the same number of calories or carbohydrates. It is difficult to get your body what it needs when you eat foods with fewer nutrients. Examples of foods that you should avoid that are high in calories and carbohydrates but low in nutrients include:  Trans fats (most processed foods list trans fats on the Nutrition Facts label).  Regular soda.  Juice.  Candy.  Sweets, such as cake, pie, doughnuts, and cookies.  Fried foods. WHAT FOODS CAN I EAT? Have nutrient-rich foods, which will nourish your body and keep you healthy. The food you should eat also will depend on several factors, including:  The calories you need.  The medicines you take.  Your weight.  Your blood glucose level.  Your blood pressure level.  Your cholesterol level. You also should eat a variety of foods, including:  Protein, such as meat, poultry, fish, tofu, nuts, and seeds (lean animal proteins are best).  Fruits.  Vegetables.  Dairy products, such as milk, cheese, and yogurt (low fat is best).  Breads, grains, pasta, cereal, rice, and beans.  Fats such as olive oil, trans fat-free margarine, canola oil, avocado, and olives. DOES EVERYONE WITH DIABETES MELLITUS HAVE THE SAME MEAL PLAN? Because every person with diabetes mellitus is different, there is not one meal plan that works for everyone. It is very important that you meet with a dietitian who will help you create a meal plan that is just right for you. Document Released: 10/21/2004 Document Revised: 01/29/2013 Document Reviewed: 12/21/2012 ExitCare Patient Information 2015 [employer]. This   information is not intended to replace advice given to you by your health care provider. Make sure you discuss any questions you have with your health care provider. DASH Eating Plan DASH stands for "Dietary Approaches to Stop Hypertension." The DASH eating plan is a healthy eating plan that has been shown to reduce high  blood pressure (hypertension). Additional health benefits may include reducing the risk of type 2 diabetes mellitus, heart disease, and stroke. The DASH eating plan may also help with weight loss. WHAT DO I NEED TO KNOW ABOUT THE DASH EATING PLAN? For the DASH eating plan, you will follow these general guidelines:  Choose foods with a percent daily value for sodium of less than 5% (as listed on the food label).  Use salt-free seasonings or herbs instead of table salt or sea salt.  Check with your health care provider or pharmacist before using salt substitutes.  Eat lower-sodium products, often labeled as "lower sodium" or "no salt added."  Eat fresh foods.  Eat more vegetables, fruits, and low-fat dairy products.  Choose whole grains. Look for the word "whole" as the first word in the ingredient list.  Choose fish and skinless chicken or turkey more often than red meat. Limit fish, poultry, and meat to 6 oz (170 g) each day.  Limit sweets, desserts, sugars, and sugary drinks.  Choose heart-healthy fats.  Limit cheese to 1 oz (28 g) per day.  Eat more home-cooked food and less restaurant, buffet, and fast food.  Limit fried foods.  Cook foods using methods other than frying.  Limit canned vegetables. If you do use them, rinse them well to decrease the sodium.  When eating at a restaurant, ask that your food be prepared with less salt, or no salt if possible. WHAT FOODS CAN I EAT? Seek help from a dietitian for individual calorie needs. Grains Whole grain or whole wheat bread. Brown rice. Whole grain or whole wheat pasta. Quinoa, bulgur, and whole grain cereals. Low-sodium cereals. Corn or whole wheat flour tortillas. Whole grain cornbread. Whole grain crackers. Low-sodium crackers. Vegetables Fresh or frozen vegetables (raw, steamed, roasted, or grilled). Low-sodium or reduced-sodium tomato and vegetable juices. Low-sodium or reduced-sodium tomato sauce and paste. Low-sodium  or reduced-sodium canned vegetables.  Fruits All fresh, canned (in natural juice), or frozen fruits. Meat and Other Protein Products Ground beef (85% or leaner), grass-fed beef, or beef trimmed of fat. Skinless chicken or turkey. Ground chicken or turkey. Pork trimmed of fat. All fish and seafood. Eggs. Dried beans, peas, or lentils. Unsalted nuts and seeds. Unsalted canned beans. Dairy Low-fat dairy products, such as skim or 1% milk, 2% or reduced-fat cheeses, low-fat ricotta or cottage cheese, or plain low-fat yogurt. Low-sodium or reduced-sodium cheeses. Fats and Oils Tub margarines without trans fats. Light or reduced-fat mayonnaise and salad dressings (reduced sodium). Avocado. Safflower, olive, or canola oils. Natural peanut or almond butter. Other Unsalted popcorn and pretzels. The items listed above may not be a complete list of recommended foods or beverages. Contact your dietitian for more options. WHAT FOODS ARE NOT RECOMMENDED? Grains White bread. White pasta. White rice. Refined cornbread. Bagels and croissants. Crackers that contain trans fat. Vegetables Creamed or fried vegetables. Vegetables in a cheese sauce. Regular canned vegetables. Regular canned tomato sauce and paste. Regular tomato and vegetable juices. Fruits Dried fruits. Canned fruit in light or heavy syrup. Fruit juice. Meat and Other Protein Products Fatty cuts of meat. Ribs, chicken wings, bacon, sausage, bologna, salami, chitterlings, fatback, hot   dogs, bratwurst, and packaged luncheon meats. Salted nuts and seeds. Canned beans with salt. Dairy Whole or 2% milk, cream, half-and-half, and cream cheese. Whole-fat or sweetened yogurt. Full-fat cheeses or blue cheese. Nondairy creamers and whipped toppings. Processed cheese, cheese spreads, or cheese curds. Condiments Onion and garlic salt, seasoned salt, table salt, and sea salt. Canned and packaged gravies. Worcestershire sauce. Tartar sauce. Barbecue sauce.  Teriyaki sauce. Soy sauce, including reduced sodium. Steak sauce. Fish sauce. Oyster sauce. Cocktail sauce. Horseradish. Ketchup and mustard. Meat flavorings and tenderizers. Bouillon cubes. Hot sauce. Tabasco sauce. Marinades. Taco seasonings. Relishes. Fats and Oils Butter, stick margarine, lard, shortening, ghee, and bacon fat. Coconut, palm kernel, or palm oils. Regular salad dressings. Other Pickles and olives. Salted popcorn and pretzels. The items listed above may not be a complete list of foods and beverages to avoid. Contact your dietitian for more information. WHERE CAN I FIND MORE INFORMATION? National Heart, Lung, and Blood Institute: www.nhlbi.nih.gov/health/health-topics/topics/dash/ Document Released: 01/13/2011 Document Revised: 06/10/2013 Document Reviewed: 11/28/2012 ExitCare Patient Information 2015 [employer]. This information is not intended to replace advice given to you by your health care provider. Make sure you discuss any questions you have with your health care provider.  

## 2014-07-02 NOTE — Progress Notes (Signed)
MRN: [id number] Name: Stanley Chapman  Sex: male Age: 49 y.o. DOB: 1965/10/24  Allergies: Losartan  Chief Complaint  Patient presents with  . Follow-up    diabetes    HPI: Patient is 49 y.o. male who history of hypertension, diabetes, hyperlipidemia, initially his blood pressure was elevated, repeat manual blood pressure is 130/70, he denies any headache dizziness chest and shortness of breath, for diabetes he has been taking metformin 500 mg daily, his current hemoglobin A1c 6.1%. Patient denies any hypoglycemic symptoms. He is requesting refill on his meds.  Past Medical History  Diagnosis Date  . Diabetes mellitus without complication   . Hypertension   . Constipation   . Renal lesion   . Melanosis coli     Past Surgical History  Procedure Laterality Date  . Hernia repair        Medication List       This list is accurate as of: 07/02/14 11:03 AM.  Always use your most recent med list.               glucose monitoring kit monitoring kit  1 each by Does not apply route 4 (four) times daily - after meals and at bedtime. 1 month Diabetic Testing Supplies for QAC-QHS accuchecks.     Linaclotide 290 MCG Caps capsule  Commonly known as:  LINZESS  Take 1 capsule (290 mcg total) by mouth daily.     losartan-hydrochlorothiazide 50-12.5 MG per tablet  Commonly known as:  HYZAAR  Take 1 tablet by mouth daily.     metFORMIN 500 MG tablet  Commonly known as:  GLUCOPHAGE  Take 1 tablet (500 mg total) by mouth daily with breakfast.     multivitamins ther. w/minerals Tabs tablet  Take 1 tablet by mouth daily.     omeprazole 20 MG capsule  Commonly known as:  PRILOSEC  Take 1 capsule (20 mg total) by mouth daily.     OVER THE COUNTER MEDICATION  Herbal laxative     rosuvastatin 10 MG tablet  Commonly known as:  CRESTOR  Take 1 tablet (10 mg total) by mouth daily.        Meds ordered this encounter  Medications  . losartan-hydrochlorothiazide (HYZAAR)  50-12.5 MG per tablet    Sig: Take 1 tablet by mouth daily.    Dispense:  30 tablet    Refill:  3  . metFORMIN (GLUCOPHAGE) 500 MG tablet    Sig: Take 1 tablet (500 mg total) by mouth daily with breakfast.    Dispense:  90 tablet    Refill:  3  . omeprazole (PRILOSEC) 20 MG capsule    Sig: Take 1 capsule (20 mg total) by mouth daily.    Dispense:  30 capsule    Refill:  3  . rosuvastatin (CRESTOR) 10 MG tablet    Sig: Take 1 tablet (10 mg total) by mouth daily.    Dispense:  90 tablet    Refill:  3     There is no immunization history on file for this patient.  Family History  Problem Relation Age of Onset  . Hypertension Mother   . Diabetes Mother   . Cancer Mother     Not sure of type    History  Substance Use Topics  . Smoking status: Former Smoker    Types: Cigarettes  . Smokeless tobacco: Not on file  . Alcohol Use: No    Review of Systems   As noted  in HPI  Filed Vitals:   07/02/14 1041  BP: 130/70  Pulse:   Temp:   Resp:     Physical Exam  Physical Exam  Constitutional: No distress.  Eyes: EOM are normal. Pupils are equal, round, and reactive to light.  Cardiovascular: Normal rate and regular rhythm.   Pulmonary/Chest: Breath sounds normal. No respiratory distress. He has no wheezes. He has no rales.  Musculoskeletal: He exhibits no edema.    CBC    Component Value Date/Time   WBC 4.8 03/28/2013 1300   RBC 5.41 03/28/2013 1300   HGB 17.7* 04/28/2013 1121   HCT 52.0 04/28/2013 1121   PLT 265 03/28/2013 1300   MCV 82.6 03/28/2013 1300   LYMPHSABS 1.1 03/28/2013 1300   MONOABS 0.3 03/28/2013 1300   EOSABS 0.4 03/28/2013 1300   BASOSABS 0.0 03/28/2013 1300    CMP     Component Value Date/Time   NA 140 04/28/2013 1121   K 4.3 04/28/2013 1121   CL 97 04/28/2013 1121   CO2 31 03/28/2013 1300   GLUCOSE 123* 04/28/2013 1121   BUN 22 04/28/2013 1121   CREATININE 1.41* 11/29/2013 0800   CREATININE 1.33 03/28/2013 1300   CALCIUM 9.3  03/28/2013 1300   PROT 6.9 03/28/2013 1300   ALBUMIN 4.4 03/28/2013 1300   AST 31 03/28/2013 1300   ALT 37 03/28/2013 1300   ALKPHOS 58 03/28/2013 1300   BILITOT 0.7 03/28/2013 1300   GFRNONAA 58* 11/29/2013 0800   GFRAA 67* 11/29/2013 0800    Lab Results  Component Value Date/Time   CHOL 127 08/14/2013 10:04 AM    Lab Results  Component Value Date/Time   HGBA1C 6.10 07/02/2014 10:18 AM   HGBA1C 6.7* 03/19/2011 06:32 AM    Lab Results  Component Value Date/Time   AST 31 03/28/2013 01:00 PM    Assessment and Plan  Other specified diabetes mellitus without complications - Plan:  Results for orders placed or performed in visit on 07/02/14  HgB A1c  Result Value Ref Range   Hemoglobin A1C 6.10    Diabetes is fairly well controlled continue with metformin, HgB A1c, metFORMIN (GLUCOPHAGE) 500 MG tablet, COMPLETE METABOLIC PANEL WITH GFR  High cholesterol - Plan: continue rosuvastatin (CRESTOR) 10 MG tablet, we'll check Lipid panel  Gastroesophageal reflux disease, esophagitis presence not specified - Plan:lifestyle modification, continue with omeprazole (PRILOSEC) 20 MG capsule  Essential hypertension - Plan: blood pressure is well controlled, continue with losartan-hydrochlorothiazide (HYZAAR) 50-12.5 MG per tablet    Return in about 3 months (around 10/02/2014) for diabetes, hypertension, hyperipidemia.   This note has been created with Surveyor, quantity. Any transcriptional errors are unintentional.    Lorayne Marek, MD

## 2014-07-02 NOTE — Progress Notes (Signed)
Patient here for follow up on his diabetes and refills on his medications

## 2014-07-08 ENCOUNTER — Telehealth: Payer: Self-pay

## 2014-07-08 NOTE — Telephone Encounter (Signed)
Patient not available Left message on voice mail to return our call 

## 2014-07-08 NOTE — Telephone Encounter (Signed)
-----   Message from Lorayne Marek, MD sent at 07/03/2014  9:21 AM EDT ----- Call and let the patient know rest of blood work is normal except for his cholesterol has increased compared to last blood work, continue with low-fat diet and Crestor.

## 2014-07-25 ENCOUNTER — Other Ambulatory Visit: Payer: Self-pay | Admitting: Internal Medicine

## 2014-07-25 DIAGNOSIS — E7800 Pure hypercholesterolemia, unspecified: Secondary | ICD-10-CM

## 2014-07-25 MED ORDER — ROSUVASTATIN CALCIUM 10 MG PO TABS: 10.0000 mg | ORAL_TABLET | Freq: Every day | ORAL | Status: DC

## 2014-07-28 ENCOUNTER — Ambulatory Visit: Payer: Self-pay

## 2014-08-18 ENCOUNTER — Other Ambulatory Visit: Payer: Self-pay | Admitting: Internal Medicine

## 2014-11-18 ENCOUNTER — Other Ambulatory Visit: Payer: Self-pay | Admitting: Internal Medicine

## 2014-11-19 ENCOUNTER — Telehealth: Payer: Self-pay | Admitting: Internal Medicine

## 2014-11-19 NOTE — Telephone Encounter (Signed)
Pt came in to request a refill on losartan-hydrochlorothiazide. Please contact the pt once it is ready. Thank you, Fonda Kinder, ASA

## 2014-12-04 ENCOUNTER — Other Ambulatory Visit: Payer: Self-pay

## 2014-12-04 DIAGNOSIS — I10 Essential (primary) hypertension: Secondary | ICD-10-CM

## 2014-12-04 MED ORDER — LOSARTAN POTASSIUM-HCTZ 50-12.5 MG PO TABS
1.0000 | ORAL_TABLET | Freq: Every day | ORAL | Status: DC
Start: 2014-12-04 — End: 2015-05-06

## 2015-02-20 ENCOUNTER — Other Ambulatory Visit: Payer: Self-pay | Admitting: Internal Medicine

## 2015-02-20 MED FILL — metFORMIN HCL 500 MG TABS: 500 | 30 days supply | Qty: 30 | Fill #6

## 2015-02-20 MED FILL — LOSARTAN-HCTZ 50-12.5 MG TA: 50-12.5 | 30 days supply | Qty: 30 | Fill #2

## 2015-03-16 MED FILL — metFORMIN HCL 500 MG TABS: 500 | 30 days supply | Qty: 30 | Fill #7

## 2015-03-16 MED FILL — LOSARTAN-HCTZ 50-12.5 MG TA: 50-12.5 | 30 days supply | Qty: 30 | Fill #3

## 2015-04-28 ENCOUNTER — Other Ambulatory Visit: Payer: Self-pay | Admitting: Internal Medicine

## 2015-04-28 MED FILL — metFORMIN HCL 500 MG TABS: 500 | 30 days supply | Qty: 30 | Fill #8

## 2015-05-06 ENCOUNTER — Other Ambulatory Visit: Payer: Self-pay | Admitting: Internal Medicine

## 2015-05-18 ENCOUNTER — Encounter: Payer: Self-pay | Admitting: Internal Medicine

## 2015-05-18 ENCOUNTER — Ambulatory Visit: Payer: Self-pay | Attending: Internal Medicine | Admitting: Internal Medicine

## 2015-05-18 VITALS — BP 132/87 | HR 75 | Temp 98.0°F | Resp 18 | Ht 71.0 in | Wt 213.0 lb

## 2015-05-18 DIAGNOSIS — K5900 Constipation, unspecified: Secondary | ICD-10-CM | POA: Insufficient documentation

## 2015-05-18 DIAGNOSIS — H538 Other visual disturbances: Secondary | ICD-10-CM | POA: Insufficient documentation

## 2015-05-18 DIAGNOSIS — E119 Type 2 diabetes mellitus without complications: Secondary | ICD-10-CM | POA: Insufficient documentation

## 2015-05-18 DIAGNOSIS — Z87891 Personal history of nicotine dependence: Secondary | ICD-10-CM | POA: Insufficient documentation

## 2015-05-18 DIAGNOSIS — I10 Essential (primary) hypertension: Secondary | ICD-10-CM | POA: Insufficient documentation

## 2015-05-18 DIAGNOSIS — Z888 Allergy status to other drugs, medicaments and biological substances status: Secondary | ICD-10-CM | POA: Insufficient documentation

## 2015-05-18 DIAGNOSIS — Z79899 Other long term (current) drug therapy: Secondary | ICD-10-CM | POA: Insufficient documentation

## 2015-05-18 DIAGNOSIS — N400 Enlarged prostate without lower urinary tract symptoms: Secondary | ICD-10-CM | POA: Insufficient documentation

## 2015-05-18 DIAGNOSIS — E785 Hyperlipidemia, unspecified: Secondary | ICD-10-CM | POA: Insufficient documentation

## 2015-05-18 LAB — GLUCOSE, POCT (MANUAL RESULT ENTRY): POC GLUCOSE: 129 mg/dL — AB (ref 70–99)

## 2015-05-18 LAB — POCT GLYCOSYLATED HEMOGLOBIN (HGB A1C): Hemoglobin A1C: 6.8

## 2015-05-18 MED ORDER — FINASTERIDE 5 MG PO TABS: 5.0000 mg | ORAL_TABLET | Freq: Every day | ORAL | Status: DC

## 2015-05-18 MED ORDER — LINACLOTIDE 290 MCG PO CAPS: 290.0000 ug | ORAL_CAPSULE | Freq: Every day | ORAL | Status: DC

## 2015-05-18 MED ORDER — PSYLLIUM 58.6 % PO POWD: 1.0000 | Freq: Three times a day (TID) | ORAL | Status: DC

## 2015-05-18 MED ORDER — METFORMIN HCL 500 MG PO TABS: 500.0000 mg | ORAL_TABLET | Freq: Two times a day (BID) | ORAL | Status: DC

## 2015-05-18 MED ORDER — HYDROCHLOROTHIAZIDE 25 MG PO TABS: 25.0000 mg | ORAL_TABLET | Freq: Every day | ORAL | Status: DC

## 2015-05-18 MED FILL — FINASTERIDE 5 MG TABLET: 5 | 30 days supply | Qty: 30 | Fill #0

## 2015-05-18 MED FILL — HYDROCHLOROTHIAZIDE 25 MG T: 25 | 30 days supply | Qty: 30 | Fill #0

## 2015-05-18 MED FILL — ?LINZESS 290 MCG CAPSULE: 290 | 30 days supply | Qty: 30 | Fill #0

## 2015-05-18 NOTE — Patient Instructions (Signed)
Low-Sodium Eating Plan Sodium raises blood pressure and causes water to be held in the body. Getting less sodium from food will help lower your blood pressure, reduce any swelling, and protect your heart, liver, and kidneys. We get sodium by adding salt (sodium chloride) to food. Most of our sodium comes from canned, boxed, and frozen foods. Restaurant foods, fast foods, and pizza are also very high in sodium. Even if you take medicine to lower your blood pressure or to reduce fluid in your body, getting less sodium from your food is important. WHAT IS MY PLAN? Most people should limit their sodium intake to 2,300 mg a day. Your health care provider recommends that you limit your sodium intake to __________ a day.  WHAT DO I NEED TO KNOW ABOUT THIS EATING PLAN? For the low-sodium eating plan, you will follow these general guidelines:  Choose foods with a % Daily Value for sodium of less than 5% (as listed on the food label).   Use salt-free seasonings or herbs instead of table salt or sea salt.   Check with your health care provider or pharmacist before using salt substitutes.   Eat fresh foods.  Eat more vegetables and fruits.  Limit canned vegetables. If you do use them, rinse them well to decrease the sodium.   Limit cheese to 1 oz (28 g) per day.   Eat lower-sodium products, often labeled as "lower sodium" or "no salt added."  Avoid foods that contain monosodium glutamate (MSG). MSG is sometimes added to [place] food and some canned foods.  Check food labels (Nutrition Facts labels) on foods to learn how much sodium is in one serving.  Eat more home-cooked food and less restaurant, buffet, and fast food.  When eating at a restaurant, ask that your food be prepared with less salt, or no salt if possible.  HOW DO I READ FOOD LABELS FOR SODIUM INFORMATION? The Nutrition Facts label lists the amount of sodium in one serving of the food. If you eat more than one serving, you  must multiply the listed amount of sodium by the number of servings. Food labels may also identify foods as:  Sodium free--Less than 5 mg in a serving.  Very low sodium--35 mg or less in a serving.  Low sodium--140 mg or less in a serving.  Light in sodium--50% less sodium in a serving. For example, if a food that usually has 300 mg of sodium is changed to become light in sodium, it will have 150 mg of sodium.  Reduced sodium--25% less sodium in a serving. For example, if a food that usually has 400 mg of sodium is changed to reduced sodium, it will have 300 mg of sodium. WHAT FOODS CAN I EAT? Grains Low-sodium cereals, including oats, puffed wheat and rice, and shredded wheat cereals. Low-sodium crackers. Unsalted rice and pasta. Lower-sodium bread.  Vegetables Frozen or fresh vegetables. Low-sodium or reduced-sodium canned vegetables. Low-sodium or reduced-sodium tomato sauce and paste. Low-sodium or reduced-sodium tomato and vegetable juices.  Fruits Fresh, frozen, and canned fruit. Fruit juice.  Meat and Other Protein Products Low-sodium canned tuna and salmon. Fresh or frozen meat, poultry, seafood, and fish. Lamb. Unsalted nuts. Dried beans, peas, and lentils without added salt. Unsalted canned beans. Homemade soups without salt. Eggs.  Dairy Milk. Soy milk. Ricotta cheese. Low-sodium or reduced-sodium cheeses. Yogurt.  Condiments Fresh and dried herbs and spices. Salt-free seasonings. Onion and garlic powders. Low-sodium varieties of mustard and ketchup. Fresh or refrigerated horseradish. [place]  juice.  Fats and Oils Reduced-sodium salad dressings. Unsalted butter.  Other Unsalted popcorn and pretzels.  The items listed above may not be a complete list of recommended foods or beverages. Contact your dietitian for more options. WHAT FOODS ARE NOT RECOMMENDED? Grains Instant hot cereals. Bread stuffing, pancake, and biscuit mixes. Croutons. Seasoned rice or pasta mixes.  Noodle soup cups. Boxed or frozen macaroni and cheese. Self-rising flour. Regular salted crackers. Vegetables Regular canned vegetables. Regular canned tomato sauce and paste. Regular tomato and vegetable juices. Frozen vegetables in sauces. Salted [place] fries. Olives. Angie Fava. Relishes. Sauerkraut. Salsa. Meat and Other Protein Products Salted, canned, smoked, spiced, or pickled meats, seafood, or fish. Bacon, ham, sausage, hot dogs, corned beef, chipped beef, and packaged luncheon meats. Salt pork. Jerky. Pickled herring. Anchovies, regular canned tuna, and sardines. Salted nuts. Dairy Processed cheese and cheese spreads. Cheese curds. Blue cheese and cottage cheese. Buttermilk.  Condiments Onion and garlic salt, seasoned salt, table salt, and sea salt. Canned and packaged gravies. Worcestershire sauce. Tartar sauce. Barbecue sauce. Teriyaki sauce. Soy sauce, including reduced sodium. Steak sauce. Fish sauce. Oyster sauce. Cocktail sauce. Horseradish that you find on the shelf. Regular ketchup and mustard. Meat flavorings and tenderizers. Bouillon cubes. Hot sauce. Tabasco sauce. Marinades. Taco seasonings. Relishes. Fats and Oils Regular salad dressings. Salted butter. Margarine. Ghee. Bacon fat.  Other Potato and tortilla chips. Corn chips and puffs. Salted popcorn and pretzels. Canned or dried soups. Pizza. Frozen entrees and pot pies.  The items listed above may not be a complete list of foods and beverages to avoid. Contact your dietitian for more information.   This information is not intended to replace advice given to you by your health care provider. Make sure you discuss any questions you have with your health care provider.   Document Released: 07/16/2001 Document Revised: 02/14/2014 Document Reviewed: 11/28/2012 Elsevier Interactive Patient Education 2016 [employer].   Diabetes Mellitus and Food It is important for you to manage your blood sugar (glucose) level. Your  blood glucose level can be greatly affected by what you eat. Eating healthier foods in the appropriate amounts throughout the day at about the same time each day will help you control your blood glucose level. It can also help slow or prevent worsening of your diabetes mellitus. Healthy eating may even help you improve the level of your blood pressure and reach or maintain a healthy weight.  General recommendations for healthful eating and cooking habits include:  Eating meals and snacks regularly. Avoid going long periods of time without eating to lose weight.  Eating a diet that consists mainly of plant-based foods, such as fruits, vegetables, nuts, legumes, and whole grains.  Using low-heat cooking methods, such as baking, instead of high-heat cooking methods, such as deep frying. Work with your dietitian to make sure you understand how to use the Nutrition Facts information on food labels. HOW CAN FOOD AFFECT ME? Carbohydrates Carbohydrates affect your blood glucose level more than any other type of food. Your dietitian will help you determine how many carbohydrates to eat at each meal and teach you how to count carbohydrates. Counting carbohydrates is important to keep your blood glucose at a healthy level, especially if you are using insulin or taking certain medicines for diabetes mellitus. Alcohol Alcohol can cause sudden decreases in blood glucose (hypoglycemia), especially if you use insulin or take certain medicines for diabetes mellitus. Hypoglycemia can be a life-threatening condition. Symptoms of hypoglycemia (sleepiness, dizziness, and disorientation) are similar  to symptoms of having too much alcohol.  If your health care provider has given you approval to drink alcohol, do so in moderation and use the following guidelines:  Women should not have more than one drink per day, and men should not have more than two drinks per day. One drink is equal to:  12 oz of beer.  5 oz of  wine.  1 oz of hard liquor.  Do not drink on an empty stomach.  Keep yourself hydrated. Have water, diet soda, or unsweetened iced tea.  Regular soda, juice, and other mixers might contain a lot of carbohydrates and should be counted. WHAT FOODS ARE NOT RECOMMENDED? As you make food choices, it is important to remember that all foods are not the same. Some foods have fewer nutrients per serving than other foods, even though they might have the same number of calories or carbohydrates. It is difficult to get your body what it needs when you eat foods with fewer nutrients. Examples of foods that you should avoid that are high in calories and carbohydrates but low in nutrients include:  Trans fats (most processed foods list trans fats on the Nutrition Facts label).  Regular soda.  Juice.  Candy.  Sweets, such as cake, pie, doughnuts, and cookies.  Fried foods. WHAT FOODS CAN I EAT? Eat nutrient-rich foods, which will nourish your body and keep you healthy. The food you should eat also will depend on several factors, including:  The calories you need.  The medicines you take.  Your weight.  Your blood glucose level.  Your blood pressure level.  Your cholesterol level. You should eat a variety of foods, including:  Protein.  Lean cuts of meat.  Proteins low in saturated fats, such as fish, egg whites, and beans. Avoid processed meats.  Fruits and vegetables.  Fruits and vegetables that may help control blood glucose levels, such as apples, mangoes, and yams.  Dairy products.  Choose fat-free or low-fat dairy products, such as milk, yogurt, and cheese.  Grains, bread, pasta, and rice.  Choose whole grain products, such as multigrain bread, whole oats, and brown rice. These foods may help control blood pressure.  Fats.  Foods containing healthful fats, such as nuts, avocado, olive oil, canola oil, and fish. DOES EVERYONE WITH DIABETES MELLITUS HAVE THE SAME MEAL  PLAN? Because every person with diabetes mellitus is different, there is not one meal plan that works for everyone. It is very important that you meet with a dietitian who will help you create a meal plan that is just right for you.   This information is not intended to replace advice given to you by your health care provider. Make sure you discuss any questions you have with your health care provider.   Document Released: 10/21/2004 Document Revised: 02/14/2014 Document Reviewed: 12/21/2012 Elsevier Interactive Patient Education [employer].

## 2015-05-18 NOTE — Progress Notes (Signed)
Patient is here to establish care.  Patient denies pain at this time.  Patient complains of of urinary retention and stated he needs to FU with urologist.  Patient request refills on losartan HCTZ

## 2015-05-18 NOTE — Progress Notes (Addendum)
Stanley Chapman, is a 50 y.o. male  MKL:[id number]  [id number]:[id number]  DOB - Jun 19, 1965  CC:  Chief Complaint  Patient presents with  . Establish Care    Medications       HPI: Stanley Chapman is a 50 y.o. male here today to establish medical care.  Patient with significant past medical history  hypertension, diabetes, and hyperlipidemia.  Last seen in our clinic in May 2016.  He states he is watching what he eats more last last couple weeks, but he does mention he has been pretty bad with his sugar intake the last few months prior.  He states he has had some blurry vision when his eats a lot of sugars and has not had an eye exam recently.  Also, he mentions he had colonoscopy back in 2015 for constipation for which she takes MiraLAX every other day and Linzess which he is is out of refills for.    Patient has No headache, No chest pain, No abdominal pain - No Nausea, No new weakness tingling or numbness, No Cough - SOB.  Allergies  Allergen Reactions  . Flomax [Tamsulosin Hcl]    Past Medical History  Diagnosis Date  . Diabetes mellitus without complication ([place])   . Hypertension   . Constipation   . Renal lesion   . Melanosis coli    Current Outpatient Prescriptions on File Prior to Visit  Medication Sig Dispense Refill  . glucose monitoring kit (FREESTYLE) monitoring kit 1 each by Does not apply route 4 (four) times daily - after meals and at bedtime. 1 month Diabetic Testing Supplies for QAC-QHS accuchecks. 1 each 1  . Multiple Vitamins-Minerals (MULTIVITAMINS THER. W/MINERALS) TABS Take 1 tablet by mouth daily.    Marland Kitchen omeprazole (PRILOSEC) 20 MG capsule Take 1 capsule (20 mg total) by mouth daily. 30 capsule 3  . OVER THE COUNTER MEDICATION Herbal laxative    . rosuvastatin (CRESTOR) 10 MG tablet Take 1 tablet (10 mg total) by mouth daily. 90 tablet 3   No current facility-administered medications on file prior to visit.   Family History  Problem Relation Age of Onset  .  Hypertension Mother   . Diabetes Mother   . Cancer Mother     Not sure of type   Social History   Social History  . Marital Status: Divorced    Spouse Name: N/A  . Number of Children: N/A  . Years of Education: N/A   Occupational History  . Not on file.   Social History Main Topics  . Smoking status: Former Smoker    Types: Cigarettes  . Smokeless tobacco: Not on file  . Alcohol Use: No  . Drug Use: No     Comment: former marijuana use - x 20 years ago  . Sexual Activity: Not on file   Other Topics Concern  . Not on file   Social History Narrative  currently not smoking/drinking etoh. Works multiple jobs.    Review of Systems: Constitutional: Negative for fever, chills, diaphoresis, activity change, appetite change and fatigue. HENT: Negative for ear pain, nosebleeds, congestion, facial swelling, rhinorrhea, neck pain, neck stiffness and ear discharge.  Eyes: Negative for pain, discharge, redness, itching and visual disturbance. Respiratory: Negative for cough, choking, chest tightness, shortness of breath, wheezing and stridor.  Cardiovascular: Negative for chest pain, palpitations; + leg swelling when eats salty foods. Gastrointestinal: Negative for abdominal distention. Genitourinary: Negative for dysuria, urgency, frequency, hematuria, flank pain, + decreased urine volume andd difficulty  urinating in am, but improves throughout day. Use to be on something for BPH which helped.  Denies dyspareunia.  Musculoskeletal: Negative for back pain, joint swelling, arthralgia and gait problem. Neurological: Negative for dizziness, tremors, seizures, syncope, facial asymmetry, speech difficulty, weakness, light-headedness, numbness and headaches.  Hematological: Negative for adenopathy. Does not bruise/bleed easily. Psychiatric/Behavioral: Negative for hallucinations, behavioral problems, confusion, dysphoric mood, decreased concentration and agitation.    Objective:   Filed  Vitals:   05/18/15 1652  BP: 132/87  Pulse: 75  Temp: 98 F (36.7 C)  Resp: 18    Physical Exam: Constitutional: Patient appears well-developed and well-nourished. No distress. AAOx3, pleasant HENT: Normocephalic, atraumatic, External right and left ear normal. Oropharynx is clear and moist.  Eyes: Conjunctivae and EOM are normal. PERRL, no scleral icterus. Neck: Normal ROM. Neck supple. No JVD.  CVS: RRR, S1/S2 +, no murmurs, no gallops, no carotid bruit.  Pulmonary: Effort and breath sounds normal, no stridor, rhonchi, wheezes, rales.  Abdominal: Soft. BS +, no distension, tenderness, rebound or guarding.  Musculoskeletal: Normal range of motion. No edema and no tenderness.  LE: bilat foot exam, good condition, no ulcers noted.  2+ perif. Pulses bilateral.  Sensation intact. Neuro: Alert. muscle tone coordination. No cranial nerve deficit grossly. Skin: Skin is warm and dry. No rash noted. Not diaphoretic. No erythema. No pallor. Psychiatric: Normal mood and affect. Behavior, judgment, thought content normal.  Lab Results  Component Value Date   WBC 4.8 03/28/2013   HGB 17.7* 04/28/2013   HCT 52.0 04/28/2013   MCV 82.6 03/28/2013   PLT 265 03/28/2013   Lab Results  Component Value Date   CREATININE 1.22 07/02/2014   BUN 23 07/02/2014   NA 133* 07/02/2014   K 4.1 07/02/2014   CL 99 07/02/2014   CO2 29 07/02/2014    Lab Results  Component Value Date   HGBA1C 6.8 05/18/2015   Lipid Panel     Component Value Date/Time   CHOL 166 07/02/2014 1102   TRIG 51 07/02/2014 1102   HDL 47 07/02/2014 1102   CHOLHDL 3.5 07/02/2014 1102   VLDL 10 07/02/2014 1102   LDLCALC 109* 07/02/2014 1102       Assessment and plan:   1. Type 2 diabetes mellitus without complication, without long-term current use of insulin (HCC) - renewed metformin 500bid = low carb diet discussed, info given - POCT A1C 6.8 - Glucose (CBG) 129 today - CBC with Differential - Basic Metabolic  Panel   2. Essential hypertension Has been off his meds for at least 2-3 wks, bp actually pretty good, admitts - low salt diet info given/discussed - hctz 25 for now  3. BPH (benign prostatic hyperplasia) -trial finasteride, chk psa, normal in 04/2013 (0.58)  4. Hx of hyperlipidiema - hasnt been on statin x 1 year - fasting lipids pending for future.  5. healthmaintenance chk tsh, gaining weight recently, but suspect due to calorie intake - had colonoscopy 2015, repeat in 10 years, constipation noted  6. Constipation Renewed Linzess, trial psyllium, urged miralax use to occasional only.  7. Blurry vision - pt has a card for Optom[facility]rist discount w/ him, encouraged to have his eyes checked.  Return in about 3 months (around 08/17/2015).  The patient was given clear instructions to go to ER or return to medical center if symptoms don't improve, worsen or new problems develop. The patient verbalized understanding. The patient was told to call to get lab results if they haven't heard  anything in the next week.      Maren Reamer, MD, [facility] [place], [place]   05/18/2015, 5:06 PM

## 2015-05-19 LAB — CBC WITH DIFFERENTIAL/PLATELET
Basophils Absolute: 60 cells/uL (ref 0–200)
Basophils Relative: 1 %
Eosinophils Absolute: 240 cells/uL (ref 15–500)
Eosinophils Relative: 4 %
HEMATOCRIT: 44.2 % (ref 38.5–50.0)
HEMOGLOBIN: 15 g/dL (ref 13.2–17.1)
LYMPHS ABS: 1860 {cells}/uL (ref 850–3900)
Lymphocytes Relative: 31 %
MCH: 28.7 pg (ref 27.0–33.0)
MCHC: 33.9 g/dL (ref 32.0–36.0)
MCV: 84.5 fL (ref 80.0–100.0)
MPV: 9.6 fL (ref 7.5–12.5)
Monocytes Absolute: 420 cells/uL (ref 200–950)
Monocytes Relative: 7 %
Neutro Abs: 3420 cells/uL (ref 1500–7800)
Neutrophils Relative %: 57 %
Platelets: 295 10*3/uL (ref 140–400)
RBC: 5.23 MIL/uL (ref 4.20–5.80)
RDW: 13.8 % (ref 11.0–15.0)
WBC: 6 10*3/uL (ref 3.8–10.8)

## 2015-05-19 LAB — PSA, MEDICARE: PSA: 0.85 ng/mL (ref <=4.00)

## 2015-05-19 LAB — TSH: TSH: 0.77 mIU/L (ref 0.40–4.50)

## 2015-05-19 LAB — BASIC METABOLIC PANEL
BUN: 11 mg/dL (ref 7–25)
CO2: 25 mmol/L (ref 20–31)
Calcium: 9 mg/dL (ref 8.6–10.3)
Chloride: 103 mmol/L (ref 98–110)
Creat: 1.28 mg/dL (ref 0.60–1.35)
Glucose, Bld: 140 mg/dL — ABNORMAL HIGH (ref 65–99)
Potassium: 3.7 mmol/L (ref 3.5–5.3)
Sodium: 139 mmol/L (ref 135–146)

## 2015-05-21 ENCOUNTER — Telehealth: Payer: Self-pay | Admitting: *Deleted

## 2015-05-21 NOTE — Telephone Encounter (Signed)
-----   Message from Maren Reamer, MD sent at 05/19/2015 11:29 AM EDT ----- Please call patient w/ lab results.  Everything looks good , except the glucose/sugars, but we knew that already and work on better diet/etc.  No signs of anemia, kidneys stable, and PSA (prostate cancer test was negative). Thyroid test was normal as well. thx

## 2015-05-21 NOTE — Telephone Encounter (Signed)
Medical Assistant left message on patient's home and cell voicemail. Voicemail states to give a call back to [place] with [facility] at [phone]. The office is closed tomorrow.

## 2015-06-10 MED FILL — metFORMIN HCL 500 MG TABS: 500 | 30 days supply | Qty: 30 | Fill #9

## 2015-07-09 MED FILL — ?METFORMIN HCL 500MG TABLET: 500 | 30 days supply | Qty: 60 | Fill #0

## 2015-07-09 MED FILL — HYDROCHLOROTHIAZIDE 25 MG T: 25 | 30 days supply | Qty: 30 | Fill #1

## 2015-07-09 MED FILL — ?LINZESS 290 MCG CAPSULE: 290 | 30 days supply | Qty: 30 | Fill #1

## 2015-07-09 MED FILL — FINASTERIDE 5 MG TABLET: 5 | 30 days supply | Qty: 30 | Fill #1

## 2015-08-24 MED FILL — ?METFORMIN HCL 500MG TABLET: 500 | 30 days supply | Qty: 60 | Fill #1

## 2015-08-24 MED FILL — HYDROCHLOROTHIAZIDE 25 MG T: 25 | 30 days supply | Qty: 30 | Fill #2

## 2015-08-24 MED FILL — !LINZESS 290 MCG CAPSULE: 290 | 30 days supply | Qty: 30 | Fill #2

## 2015-10-14 MED FILL — HYDROCHLOROTHIAZIDE 25 MG T: 25 | 30 days supply | Qty: 30 | Fill #3

## 2015-10-14 MED FILL — ?METFORMIN HCL 500MG TABLET: 500 | 30 days supply | Qty: 60 | Fill #2

## 2015-10-14 MED FILL — !LINZESS 290 MCG CAPSULE: 290 | 30 days supply | Qty: 30 | Fill #3

## 2015-11-13 MED FILL — ?METFORMIN HCL 500MG TABLET: 500 | 30 days supply | Qty: 60 | Fill #3

## 2015-11-13 MED FILL — !LINZESS 290 MCG CAPSULE: 290 | 30 days supply | Qty: 30 | Fill #4

## 2015-11-13 MED FILL — HYDROCHLOROTHIAZIDE 25 MG T: 25 | 30 days supply | Qty: 30 | Fill #4

## 2016-01-14 ENCOUNTER — Other Ambulatory Visit: Payer: Self-pay | Admitting: Internal Medicine

## 2016-01-14 MED FILL — HYDROCHLOROTHIAZIDE 25 MG T: 25 | 30 days supply | Qty: 30 | Fill #5

## 2016-02-05 ENCOUNTER — Other Ambulatory Visit: Payer: Self-pay | Admitting: Internal Medicine

## 2016-02-07 ENCOUNTER — Encounter (HOSPITAL_COMMUNITY): Payer: Self-pay | Admitting: Emergency Medicine

## 2016-02-07 ENCOUNTER — Emergency Department (HOSPITAL_COMMUNITY)
Admission: EM | Admit: 2016-02-07 | Discharge: 2016-02-08 | Disposition: A | Discharge status: Home / Self Care | Payer: Self-pay | Attending: Emergency Medicine | Admitting: Emergency Medicine

## 2016-02-07 DIAGNOSIS — E1165 Type 2 diabetes mellitus with hyperglycemia: Secondary | ICD-10-CM | POA: Insufficient documentation

## 2016-02-07 DIAGNOSIS — R739 Hyperglycemia, unspecified: Secondary | ICD-10-CM

## 2016-02-07 DIAGNOSIS — Z87891 Personal history of nicotine dependence: Secondary | ICD-10-CM | POA: Insufficient documentation

## 2016-02-07 DIAGNOSIS — Z7984 Long term (current) use of oral hypoglycemic drugs: Secondary | ICD-10-CM | POA: Insufficient documentation

## 2016-02-07 DIAGNOSIS — I10 Essential (primary) hypertension: Secondary | ICD-10-CM | POA: Insufficient documentation

## 2016-02-07 LAB — COMPREHENSIVE METABOLIC PANEL
ALK PHOS: 126 U/L (ref 38–126)
ALT: 37 U/L (ref 17–63)
AST: 25 U/L (ref 15–41)
Albumin: 3.4 g/dL — ABNORMAL LOW (ref 3.5–5.0)
Anion gap: 10 (ref 5–15)
BUN: 16 mg/dL (ref 6–20)
CALCIUM: 9.1 mg/dL (ref 8.9–10.3)
CHLORIDE: 100 mmol/L — AB (ref 101–111)
CO2: 27 mmol/L (ref 22–32)
Creatinine, Ser: 1.52 mg/dL — ABNORMAL HIGH (ref 0.61–1.24)
GFR calc Af Amer: >60 mL/min (ref >60)
GFR, EST NON AFRICAN AMERICAN: 52 mL/min — AB (ref >60)
Glucose, Bld: 408 mg/dL — ABNORMAL HIGH (ref 65–99)
Potassium: 3.5 mmol/L (ref 3.5–5.1)
Sodium: 137 mmol/L (ref 135–145)
Total Bilirubin: 0.5 mg/dL (ref 0.3–1.2)
Total Protein: 6.7 g/dL (ref 6.5–8.1)

## 2016-02-07 LAB — URINALYSIS, ROUTINE W REFLEX MICROSCOPIC
BACTERIA UA: NONE SEEN
Bilirubin Urine: NEGATIVE
Glucose, UA: >=500 mg/dL — AB
Hgb urine dipstick: NEGATIVE
Ketones, ur: NEGATIVE mg/dL
LEUKOCYTES UA: NEGATIVE
Nitrite: NEGATIVE
Protein, ur: NEGATIVE mg/dL
Specific Gravity, Urine: 1.025 (ref 1.005–1.030)
pH: 6 (ref 5.0–8.0)

## 2016-02-07 LAB — CBC WITH DIFFERENTIAL/PLATELET
BASOS PCT: 1 %
Basophils Absolute: 0 10*3/uL (ref 0.0–0.1)
EOS ABS: 0.3 10*3/uL (ref 0.0–0.7)
Eosinophils Relative: 6 %
HCT: 44.7 % (ref 39.0–52.0)
HEMOGLOBIN: 15 g/dL (ref 13.0–17.0)
Lymphocytes Relative: 38 %
Lymphs Abs: 1.7 10*3/uL (ref 0.7–4.0)
MCH: 28.8 pg (ref 26.0–34.0)
MCHC: 33.6 g/dL (ref 30.0–36.0)
MCV: 86 fL (ref 78.0–100.0)
MONO ABS: 0.3 10*3/uL (ref 0.1–1.0)
Monocytes Relative: 7 %
NEUTROS ABS: 2.3 10*3/uL (ref 1.7–7.7)
Neutrophils Relative %: 48 %
PLATELETS: 224 10*3/uL (ref 150–400)
RBC: 5.2 MIL/uL (ref 4.22–5.81)
RDW: 13.3 % (ref 11.5–15.5)
WBC: 4.6 10*3/uL (ref 4.0–10.5)

## 2016-02-07 LAB — CBG MONITORING, ED: Glucose-Capillary: 357 mg/dL — ABNORMAL HIGH (ref 65–99)

## 2016-02-07 MED ORDER — METFORMIN HCL 500 MG PO TABS: 500.0000 mg | ORAL_TABLET | Freq: Two times a day (BID) | ORAL | 0 refills | Status: DC

## 2016-02-07 MED ORDER — METFORMIN HCL 500 MG PO TABS
500.0000 mg | ORAL_TABLET | Freq: Once | ORAL | Status: AC
  Administered 2016-02-07: 500 mg via ORAL
  Filled 2016-02-07: qty 1

## 2016-02-07 NOTE — ED Notes (Signed)
Checked CBG 357, RN Bobby informed

## 2016-02-07 NOTE — ED Triage Notes (Signed)
Pt. reported that he ran out of his diabetic medications for 2 weeks , elevated blood sugar this evening at home= 412 with urinary frequency . Denies fever or chills .

## 2016-02-07 NOTE — ED Provider Notes (Signed)
[facility] DEPT Provider Note    By signing my name below, I, Bea Graff, attest that this documentation has been prepared under the direction and in the presence of Everlene Balls, MD. Electronically Signed: Bea Graff, ED Scribe. 02/07/16. 11:42 PM.    History   Chief Complaint Chief Complaint  Patient presents with  . Hyperglycemia   The history is provided by the patient and medical records. No language interpreter was used.    HPI Comments:  Stanley Chapman is a 50 y.o. male with PMHx of HTN and DM who presents to the Emergency Department complaining of hyperglycemia stating his CBG was 412 prior to arrival. He reports increased urination and increased thirst as well. He states he has been out of his Metformin for the past two weeks and is unsure if [facility], his PCP, is opened tomorrow. He states he has not been eating properly. He has not done anything to treat his symptoms. He denies modifying factors. He denies fever, chills, nausea, vomiting, rhinorrhea, sore throat. He is not on insulin.    Past Medical History:  Diagnosis Date  . Constipation   . Diabetes mellitus without complication ([place])   . Hypertension   . Melanosis coli   . Renal lesion     Patient Active Problem List   Diagnosis Date Noted  . DM (diabetes mellitus) ([place]) 05/14/2013  . HTN (hypertension) 05/14/2013  . Other and unspecified hyperlipidemia 05/14/2013  . RLQ abdominal pain 05/08/2013  . Special screening for malignant neoplasms, colon 05/08/2013    Past Surgical History:  Procedure Laterality Date  . HERNIA REPAIR         Home Medications    Prior to Admission medications   Medication Sig Start Date End Date Taking? Authorizing Provider  esomeprazole (NEXIUM) 20 MG capsule Take 20 mg by mouth daily at 12 noon.   Yes Historical Provider, MD  hydrochlorothiazide (HYDRODIURIL) 25 MG tablet Take 1 tablet (25 mg total) by mouth daily. 05/18/15  Yes Maren Reamer, MD  metFORMIN (GLUCOPHAGE) 500 MG tablet Take 1 tablet (500 mg total) by mouth 2 (two) times daily with a meal. Must have office visit for refills 05/18/15  Yes Maren Reamer, MD  finasteride (PROSCAR) 5 MG tablet Take 1 tablet (5 mg total) by mouth daily. Patient not taking: Reported on 02/07/2016 05/18/15   Maren Reamer, MD  glucose monitoring kit (FREESTYLE) monitoring kit 1 each by Does not apply route 4 (four) times daily - after meals and at bedtime. 1 month Diabetic Testing Supplies for QAC-QHS accuchecks. 05/13/13   Lorayne Marek, MD  Linaclotide (LINZESS) 290 MCG CAPS capsule Take 1 capsule (290 mcg total) by mouth daily. Patient not taking: Reported on 02/07/2016 05/18/15   Maren Reamer, MD  omeprazole (PRILOSEC) 20 MG capsule Take 1 capsule (20 mg total) by mouth daily. Patient not taking: Reported on 02/07/2016 07/02/14   Lorayne Marek, MD  psyllium (METAMUCIL SMOOTH TEXTURE) 58.6 % powder Take 1 packet by mouth 3 (three) times daily. Patient not taking: Reported on 02/07/2016 05/18/15   Maren Reamer, MD  rosuvastatin (CRESTOR) 10 MG tablet Take 1 tablet (10 mg total) by mouth daily. Patient not taking: Reported on 02/07/2016 07/25/14   Tresa Garter, MD    Family History Family History  Problem Relation Age of Onset  . Hypertension Mother   . Diabetes Mother   . Cancer Mother     Not sure of type  Social History Social History  Substance Use Topics  . Smoking status: Former Smoker    Types: Cigarettes  . Smokeless tobacco: Never Used  . Alcohol use No     Allergies   Flomax [tamsulosin hcl]   Review of Systems Review of Systems A complete 10 system review of systems was obtained and all systems are negative except as noted in the HPI and PMH.    Physical Exam Updated Vital Signs BP (!) 153/102 (BP Location: Left Arm)   Pulse 76   Temp 98.7 F (37.1 C) (Oral)   Resp 16   Ht '5\' 11"'  (1.803 m)   Wt 216 lb (98 kg)   SpO2 100%    BMI 30.13 kg/m   Physical Exam  Constitutional: He is oriented to person, place, and time. Vital signs are normal. He appears well-developed and well-nourished.  Non-toxic appearance. He does not appear ill. No distress.  HENT:  Head: Normocephalic and atraumatic.  Nose: Nose normal.  Mouth/Throat: Oropharynx is clear and moist. No oropharyngeal exudate.  Eyes: Conjunctivae and EOM are normal. Pupils are equal, round, and reactive to light. No scleral icterus.  Neck: Normal range of motion. Neck supple. No tracheal deviation, no edema, no erythema and normal range of motion present. No thyroid mass and no thyromegaly present.  Cardiovascular: Normal rate, regular rhythm, S1 normal, S2 normal, normal heart sounds, intact distal pulses and normal pulses.  Exam reveals no gallop and no friction rub.   No murmur heard. Pulmonary/Chest: Effort normal and breath sounds normal. No respiratory distress. He has no wheezes. He has no rhonchi. He has no rales.  Abdominal: Soft. Normal appearance and bowel sounds are normal. He exhibits no distension, no ascites and no mass. There is no hepatosplenomegaly. There is no tenderness. There is no rebound, no guarding and no CVA tenderness.  Musculoskeletal: Normal range of motion. He exhibits no edema or tenderness.  Lymphadenopathy:    He has no cervical adenopathy.  Neurological: He is alert and oriented to person, place, and time. He has normal strength. No cranial nerve deficit or sensory deficit.  Skin: Skin is warm, dry and intact. No petechiae and no rash noted. He is not diaphoretic. No erythema. No pallor.  Nursing note and vitals reviewed.    ED Treatments / Results  DIAGNOSTIC STUDIES: Oxygen Saturation is 100% on RA, normal by my interpretation.   COORDINATION OF CARE: 11:33 PM- Will give dose of Metformin prior to discharge and give prescription for the same. Advised pt to follow up with PCP at [facility]. Pt verbalizes  understanding and agrees to plan.  Medications - No data to display  Labs (all labs ordered are listed, but only abnormal results are displayed) Labs Reviewed  URINALYSIS, ROUTINE W REFLEX MICROSCOPIC - Abnormal; Notable for the following:       Result Value   Color, Urine STRAW (*)    Glucose, UA >=500 (*)    Squamous Epithelial / LPF 0-5 (*)    All other components within normal limits  COMPREHENSIVE METABOLIC PANEL - Abnormal; Notable for the following:    Chloride 100 (*)    Glucose, Bld 408 (*)    Creatinine, Ser 1.52 (*)    Albumin 3.4 (*)    GFR calc non Af Amer 52 (*)    All other components within normal limits  CBG MONITORING, ED - Abnormal; Notable for the following:    Glucose-Capillary 357 (*)    All other  components within normal limits  CBC WITH DIFFERENTIAL/PLATELET    EKG  EKG Interpretation None       Radiology No results found.  Procedures Procedures (including critical care time)  Medications Ordered in ED Medications - No data to display   Initial Impression / Assessment and Plan / ED Course  I have reviewed the triage vital signs and the nursing notes.  Pertinent labs & imaging results that were available during my care of the patient were reviewed by me and considered in my medical decision making (see chart for details).  Clinical Course     Patient presents to the ED for high blood sugar.  He has been out of his medications, we will refill one month supply and give one dose in the ED.  There is no AG and his PE is normal.  VS remain within his normal limits and he is safe for DC.    I personally performed the services described in this documentation, which was scribed in my presence. The recorded information has been reviewed and is accurate.     Final Clinical Impressions(s) / ED Diagnoses   Final diagnoses:  None    New Prescriptions New Prescriptions   No medications on file     Everlene Balls, MD 02/07/16 2348

## 2016-02-11 MED FILL — ?METFORMIN HCL 500MG TABLET: 500 | 30 days supply | Qty: 60 | Fill #0

## 2016-02-17 ENCOUNTER — Ambulatory Visit: Payer: Self-pay | Attending: Internal Medicine | Admitting: Internal Medicine

## 2016-02-17 ENCOUNTER — Encounter: Payer: Self-pay | Admitting: Internal Medicine

## 2016-02-17 VITALS — BP 124/85 | HR 71 | Temp 98.4°F | Resp 16 | Wt 215.0 lb

## 2016-02-17 DIAGNOSIS — I10 Essential (primary) hypertension: Secondary | ICD-10-CM | POA: Insufficient documentation

## 2016-02-17 DIAGNOSIS — Z114 Encounter for screening for human immunodeficiency virus [HIV]: Secondary | ICD-10-CM | POA: Insufficient documentation

## 2016-02-17 DIAGNOSIS — Z23 Encounter for immunization: Secondary | ICD-10-CM | POA: Insufficient documentation

## 2016-02-17 DIAGNOSIS — N401 Enlarged prostate with lower urinary tract symptoms: Secondary | ICD-10-CM | POA: Insufficient documentation

## 2016-02-17 DIAGNOSIS — E785 Hyperlipidemia, unspecified: Secondary | ICD-10-CM | POA: Insufficient documentation

## 2016-02-17 DIAGNOSIS — Z7984 Long term (current) use of oral hypoglycemic drugs: Secondary | ICD-10-CM | POA: Insufficient documentation

## 2016-02-17 DIAGNOSIS — E1165 Type 2 diabetes mellitus with hyperglycemia: Secondary | ICD-10-CM | POA: Insufficient documentation

## 2016-02-17 LAB — CBC WITH DIFFERENTIAL/PLATELET
BASOS PCT: 1 %
Basophils Absolute: 54 cells/uL (ref 0–200)
Eosinophils Absolute: 216 cells/uL (ref 15–500)
Eosinophils Relative: 4 %
HEMATOCRIT: 45.6 % (ref 38.5–50.0)
Hemoglobin: 15.5 g/dL (ref 13.2–17.1)
LYMPHS PCT: 27 %
Lymphs Abs: 1458 cells/uL (ref 850–3900)
MCH: 28.9 pg (ref 27.0–33.0)
MCHC: 34 g/dL (ref 32.0–36.0)
MCV: 85.1 fL (ref 80.0–100.0)
MONOS PCT: 6 %
MPV: 9.7 fL (ref 7.5–12.5)
Monocytes Absolute: 324 cells/uL (ref 200–950)
NEUTROS PCT: 62 %
Neutro Abs: 3348 cells/uL (ref 1500–7800)
Platelets: 275 10*3/uL (ref 140–400)
RBC: 5.36 MIL/uL (ref 4.20–5.80)
RDW: 13.3 % (ref 11.0–15.0)
WBC: 5.4 10*3/uL (ref 3.8–10.8)

## 2016-02-17 LAB — CMP AND LIVER
ALBUMIN: 4.2 g/dL (ref 3.6–5.1)
ALT: 24 U/L (ref 9–46)
AST: 22 U/L (ref 10–40)
Alkaline Phosphatase: 78 U/L (ref 40–115)
BILIRUBIN DIRECT: 0.1 mg/dL (ref <=0.2)
BILIRUBIN TOTAL: 0.4 mg/dL (ref 0.2–1.2)
BUN: 20 mg/dL (ref 7–25)
CALCIUM: 9.8 mg/dL (ref 8.6–10.3)
CO2: 30 mmol/L (ref 20–31)
CREATININE: 1.41 mg/dL — AB (ref 0.60–1.35)
Chloride: 98 mmol/L (ref 98–110)
Glucose, Bld: 158 mg/dL — ABNORMAL HIGH (ref 65–99)
Indirect Bilirubin: 0.3 mg/dL (ref 0.2–1.2)
Potassium: 3.8 mmol/L (ref 3.5–5.3)
Sodium: 138 mmol/L (ref 135–146)
TOTAL PROTEIN: 7.3 g/dL (ref 6.1–8.1)

## 2016-02-17 LAB — LIPID PANEL
Cholesterol: 190 mg/dL (ref <200)
HDL: 44 mg/dL (ref >40)
LDL CALC: 125 mg/dL — AB (ref <100)
TRIGLYCERIDES: 106 mg/dL (ref <150)
Total CHOL/HDL Ratio: 4.3 Ratio (ref <5.0)
VLDL: 21 mg/dL (ref <30)

## 2016-02-17 LAB — HIV ANTIBODY (ROUTINE TESTING W REFLEX): HIV 1&2 Ab, 4th Generation: NONREACTIVE

## 2016-02-17 LAB — POCT GLYCOSYLATED HEMOGLOBIN (HGB A1C): Hemoglobin A1C: 7.9

## 2016-02-17 LAB — GLUCOSE, POCT (MANUAL RESULT ENTRY): POC Glucose: 158 mg/dl — AB (ref 70–99)

## 2016-02-17 MED ORDER — METFORMIN HCL 1000 MG PO TABS: 1000.0000 mg | ORAL_TABLET | Freq: Two times a day (BID) | ORAL | 3 refills | Status: DC

## 2016-02-17 MED ORDER — TRUEPLUS LANCETS 26G MISC: 1.0000 | Freq: Three times a day (TID) | 12 refills | Status: DC | PRN

## 2016-02-17 MED ORDER — HYDROCHLOROTHIAZIDE 25 MG PO TABS: 25.0000 mg | ORAL_TABLET | Freq: Every day | ORAL | 3 refills | Status: DC

## 2016-02-17 MED ORDER — GLUCOSE BLOOD VI STRP: ORAL_STRIP | 12 refills | Status: DC

## 2016-02-17 MED ORDER — TAMSULOSIN HCL 0.4 MG PO CAPS: 0.4000 mg | ORAL_CAPSULE | ORAL | 3 refills | Status: DC

## 2016-02-17 MED ORDER — PRAVASTATIN SODIUM 20 MG PO TABS: 20.0000 mg | ORAL_TABLET | Freq: Every day | ORAL | 3 refills | Status: DC

## 2016-02-17 MED ORDER — TRUE METRIX GO GLUCOSE METER W/DEVICE KIT: 1.0000 | PACK | Freq: Three times a day (TID) | 0 refills | Status: AC | PRN

## 2016-02-17 MED FILL — PRAVASTATIN SODIUM 20 MG TA: 20 | 30 days supply | Qty: 30 | Fill #0

## 2016-02-17 MED FILL — TRUE METRIX TEST STRIP: 33 days supply | Qty: 100 | Fill #0

## 2016-02-17 MED FILL — TAMSULOSIN HCL 0.4 MG CAP: 0.4 | 30 days supply | Qty: 15 | Fill #0

## 2016-02-17 MED FILL — !TRUE METRIX BLOOD GLUCOSE: 365 days supply | Qty: 1 | Fill #0

## 2016-02-17 MED FILL — TRUEplus LANCETS 28G MISC: 33 days supply | Qty: 100 | Fill #0

## 2016-02-17 MED FILL — HYDROCHLOROTHIAZIDE 25 MG T: 25 | 30 days supply | Qty: 30 | Fill #0

## 2016-02-17 NOTE — Patient Instructions (Addendum)
- financial aid packet, info on obamacare - Diabetes Mellitus and Food It is important for you to manage your blood sugar (glucose) level. Your blood glucose level can be greatly affected by what you eat. Eating healthier foods in the appropriate amounts throughout the day at about the same time each day will help you control your blood glucose level. It can also help slow or prevent worsening of your diabetes mellitus. Healthy eating may even help you improve the level of your blood pressure and reach or maintain a healthy weight. General recommendations for healthful eating and cooking habits include:  Eating meals and snacks regularly. Avoid going long periods of time without eating to lose weight.  Eating a diet that consists mainly of plant-based foods, such as fruits, vegetables, nuts, legumes, and whole grains.  Using low-heat cooking methods, such as baking, instead of high-heat cooking methods, such as deep frying. Work with your dietitian to make sure you understand how to use the Nutrition Facts information on food labels. How can food affect me? Carbohydrates  Carbohydrates affect your blood glucose level more than any other type of food. Your dietitian will help you determine how many carbohydrates to eat at each meal and teach you how to count carbohydrates. Counting carbohydrates is important to keep your blood glucose at a healthy level, especially if you are using insulin or taking certain medicines for diabetes mellitus. Alcohol  Alcohol can cause sudden decreases in blood glucose (hypoglycemia), especially if you use insulin or take certain medicines for diabetes mellitus. Hypoglycemia can be a life-threatening condition. Symptoms of hypoglycemia (sleepiness, dizziness, and disorientation) are similar to symptoms of having too much alcohol. If your health care provider has given you approval to drink alcohol, do so in moderation and use the following guidelines:  Women should  not have more than one drink per day, and men should not have more than two drinks per day. One drink is equal to:  12 oz of beer.  5 oz of wine.  1 oz of hard liquor.  Do not drink on an empty stomach.  Keep yourself hydrated. Have water, diet soda, or unsweetened iced tea.  Regular soda, juice, and other mixers might contain a lot of carbohydrates and should be counted. What foods are not recommended? As you make food choices, it is important to remember that all foods are not the same. Some foods have fewer nutrients per serving than other foods, even though they might have the same number of calories or carbohydrates. It is difficult to get your body what it needs when you eat foods with fewer nutrients. Examples of foods that you should avoid that are high in calories and carbohydrates but low in nutrients include:  Trans fats (most processed foods list trans fats on the Nutrition Facts label).  Regular soda.  Juice.  Candy.  Sweets, such as cake, pie, doughnuts, and cookies.  Fried foods. What foods can I eat? Eat nutrient-rich foods, which will nourish your body and keep you healthy. The food you should eat also will depend on several factors, including:  The calories you need.  The medicines you take.  Your weight.  Your blood glucose level.  Your blood pressure level.  Your cholesterol level. You should eat a variety of foods, including:  Protein.  Lean cuts of meat.  Proteins low in saturated fats, such as fish, egg whites, and beans. Avoid processed meats.  Fruits and vegetables.  Fruits and vegetables that may help control  blood glucose levels, such as apples, mangoes, and yams.  Dairy products.  Choose fat-free or low-fat dairy products, such as milk, yogurt, and cheese.  Grains, bread, pasta, and rice.  Choose whole grain products, such as multigrain bread, whole oats, and brown rice. These foods may help control blood  pressure.  Fats.  Foods containing healthful fats, such as nuts, avocado, olive oil, canola oil, and fish. Does everyone with diabetes mellitus have the same meal plan? Because every person with diabetes mellitus is different, there is not one meal plan that works for everyone. It is very important that you meet with a dietitian who will help you create a meal plan that is just right for you. This information is not intended to replace advice given to you by your health care provider. Make sure you discuss any questions you have with your health care provider. Document Released: 10/21/2004 Document Revised: 07/02/2015 Document Reviewed: 12/21/2012 Elsevier Interactive Patient Education  2017 [place] for Eating Away From Home If You Have Diabetes Controlling your level of blood glucose, also known as blood sugar, can be challenging. It can be even more difficult when you do not prepare your own meals. The following tips can help you manage your diabetes when you eat away from home. Planning ahead Plan ahead if you know you will be eating away from home:  Ask your health care provider how to time meals and medicine if you are taking insulin.  Make a list of restaurants near you that offer healthy choices. If they have a carry-out menu, take it home and plan what you will order ahead of time.  Look up the restaurant you want to eat at online. Many chain and fast-food restaurants list nutritional information online. Use this information to choose the healthiest options and to calculate how many carbohydrates will be in your meal.  Use a carbohydrate-counting book or mobile app to look up the carbohydrate content and serving size of the foods you want to eat.  Become familiar with serving sizes and learn to recognize how many servings are in a portion. This will allow you to estimate how many carbohydrates you can eat. Free foods A "free food" is any food or drink that has  less than 5 g of carbohydrates per serving. Free foods include:  Many vegetables.  Hard boiled eggs.  Nuts or seeds.  Olives.  Cheeses.  Meats. These types of foods make good appetizer choices and are often available at salad bars. Lemon juice, vinegar, or a low-calorie salad dressing of fewer than 20 calories per serving can be used as a "free" salad dressing. Choices to reduce carbohydrates  Substitute nonfat sweetened yogurt with a sugar-free yogurt. Yogurt made from soy milk may also be used, but you will still want a sugar-free or plain option to choose a lower carbohydrate amount.  Ask your server to take away the bread basket or chips from your table.  Order fresh fruit. A salad bar often offers fresh fruit choices. Avoid canned fruit because it is usually packed in sugar or syrup.  Order a salad, and eat it without dressing. Or, create a "free" salad dressing.  Ask for substitutions. For example, instead of [place] fries, request an order of a vegetable such as salad, green beans, or broccoli. Other tips  If you take insulin, take the insulin once your food arrives to your table. This will ensure your insulin and food are timed correctly.  Ask your server about the portion size before your order, and ask for a take-out box if the portion has more servings than you should have. When your food comes, leave the amount you should have on the plate, and put the rest in the take-out box.  Consider splitting an entree with someone and ordering a side salad. This information is not intended to replace advice given to you by your health care provider. Make sure you discuss any questions you have with your health care provider. Document Released: 01/24/2005 Document Revised: 07/02/2015 Document Reviewed: 04/23/2013 Elsevier Interactive Patient Education  2017 Else[employer] Inc.  Benign Prostatic Hyperplasia An enlarged prostate (benign prostatic hyperplasia) is common in older men. You  may experience the following:  Weak urine stream.  Dribbling.  Feeling like the bladder has not emptied completely.  Difficulty starting urination.  Getting up frequently at night to urinate.  Urinating more frequently during the day. HOME CARE INSTRUCTIONS  Monitor your prostatic hyperplasia for any changes. The following actions may help to alleviate any discomfort you are experiencing:  Give yourself time when you urinate.  Stay away from alcohol.  Avoid beverages containing caffeine, such as coffee, tea, and colas, because they can make the problem worse.  Avoid decongestants, antihistamines, and some prescription medicines that can make the problem worse.  Follow up with your health care provider for further treatment as recommended. SEEK MEDICAL CARE IF:  You are experiencing progressive difficulty voiding.  Your urine stream is progressively getting narrower.  You are awaking from sleep with the urge to void more frequently.  You are constantly feeling the need to void.  You experience loss of urine, especially in small amounts. SEEK IMMEDIATE MEDICAL CARE IF:   You develop increased pain with urination or are unable to urinate.  You develop severe abdominal pain, vomiting, a high fever, or fainting.  You develop back pain or blood in your urine. MAKE SURE YOU:   Understand these instructions.  Will watch your condition.  Will get help right away if you are not doing well or get worse. This information is not intended to replace advice given to you by your health care provider. Make sure you discuss any questions you have with your health care provider. Document Released: 01/24/2005 Document Revised: 02/14/2014 Document Reviewed: 06/26/2012 Elsevier Interactive Patient Education  2017 Elsevier Inc.   -  Hypertension Hypertension is another name for high blood pressure. High blood pressure forces your heart to work harder to pump blood. A blood  pressure reading has two numbers, which includes a higher number over a lower number (example: 110/72). Follow these instructions at home:  Have your blood pressure rechecked by your doctor.  Only take medicine as told by your doctor. Follow the directions carefully. The medicine does not work as well if you skip doses. Skipping doses also puts you at risk for problems.  Do not smoke.  Monitor your blood pressure at home as told by your doctor. Contact a doctor if:  You think you are having a reaction to the medicine you are taking.  You have repeat headaches or feel dizzy.  You have puffiness (swelling) in your ankles.  You have trouble with your vision. Get help right away if:  You get a very bad headache and are confused.  You feel weak, numb, or faint.  You get chest or belly (abdominal) pain.  You throw up (vomit).  You cannot breathe very well. This information is not intended to  replace advice given to you by your health care provider. Make sure you discuss any questions you have with your health care provider. Document Released: 07/13/2007 Document Revised: 07/02/2015 Document Reviewed: 11/16/2012 Elsevier Interactive Patient Education  2017 Elsevier Inc.   -  Low-Sodium Eating Plan Sodium raises blood pressure and causes water to be held in the body. Getting less sodium from food will help lower your blood pressure, reduce any swelling, and protect your heart, liver, and kidneys. We get sodium by adding salt (sodium chloride) to food. Most of our sodium comes from canned, boxed, and frozen foods. Restaurant foods, fast foods, and pizza are also very high in sodium. Even if you take medicine to lower your blood pressure or to reduce fluid in your body, getting less sodium from your food is important. What is my plan? Most people should limit their sodium intake to 2,300 mg a day. Your health care provider recommends that you limit your sodium intake to 2,000mg  a  day. What do I need to know about this eating plan? For the low-sodium eating plan, you will follow these general guidelines:  Choose foods with a % Daily Value for sodium of less than 5% (as listed on the food label).  Use salt-free seasonings or herbs instead of table salt or sea salt.  Check with your health care provider or pharmacist before using salt substitutes.  Eat fresh foods.  Eat more vegetables and fruits.  Limit canned vegetables. If you do use them, rinse them well to decrease the sodium.  Limit cheese to 1 oz (28 g) per day.  Eat lower-sodium products, often labeled as "lower sodium" or "no salt added."  Avoid foods that contain monosodium glutamate (MSG). MSG is sometimes added to [place] food and some canned foods.  Check food labels (Nutrition Facts labels) on foods to learn how much sodium is in one serving.  Eat more home-cooked food and less restaurant, buffet, and fast food.  When eating at a restaurant, ask that your food be prepared with less salt, or no salt if possible. How do I read food labels for sodium information? The Nutrition Facts label lists the amount of sodium in one serving of the food. If you eat more than one serving, you must multiply the listed amount of sodium by the number of servings. Food labels may also identify foods as:  Sodium free-Less than 5 mg in a serving.  Very low sodium-35 mg or less in a serving.  Low sodium-140 mg or less in a serving.  Light in sodium-50% less sodium in a serving. For example, if a food that usually has 300 mg of sodium is changed to become light in sodium, it will have 150 mg of sodium.  Reduced sodium-25% less sodium in a serving. For example, if a food that usually has 400 mg of sodium is changed to reduced sodium, it will have 300 mg of sodium. What foods can I eat? Grains  Low-sodium cereals, including oats, puffed wheat and rice, and shredded wheat cereals. Low-sodium crackers. Unsalted rice  and pasta. Lower-sodium bread. Vegetables  Frozen or fresh vegetables. Low-sodium or reduced-sodium canned vegetables. Low-sodium or reduced-sodium tomato sauce and paste. Low-sodium or reduced-sodium tomato and vegetable juices. Fruits  Fresh, frozen, and canned fruit. Fruit juice. Meat and Other Protein Products  Low-sodium canned tuna and salmon. Fresh or frozen meat, poultry, seafood, and fish. Lamb. Unsalted nuts. Dried beans, peas, and lentils without added salt. Unsalted canned beans. Homemade soups without  salt. Eggs. Dairy  Milk. Soy milk. Ricotta cheese. Low-sodium or reduced-sodium cheeses. Yogurt. Condiments  Fresh and dried herbs and spices. Salt-free seasonings. Onion and garlic powders. Low-sodium varieties of mustard and ketchup. Fresh or refrigerated horseradish. Lemon juice. Fats and Oils  Reduced-sodium salad dressings. Unsalted butter. Other  Unsalted popcorn and pretzels. The items listed above may not be a complete list of recommended foods or beverages. Contact your dietitian for more options.  What foods are not recommended? Grains  Instant hot cereals. Bread stuffing, pancake, and biscuit mixes. Croutons. Seasoned rice or pasta mixes. Noodle soup cups. Boxed or frozen macaroni and cheese. Self-rising flour. Regular salted crackers. Vegetables  Regular canned vegetables. Regular canned tomato sauce and paste. Regular tomato and vegetable juices. Frozen vegetables in sauces. Salted [place] fries. Olives. Angie Fava. Relishes. Sauerkraut. Salsa. Meat and Other Protein Products  Salted, canned, smoked, spiced, or pickled meats, seafood, or fish. Bacon, ham, sausage, hot dogs, corned beef, chipped beef, and packaged luncheon meats. Salt pork. Jerky. Pickled herring. Anchovies, regular canned tuna, and sardines. Salted nuts. Dairy  Processed cheese and cheese spreads. Cheese curds. Blue cheese and cottage cheese. Buttermilk. Condiments  Onion and garlic salt, seasoned salt,  table salt, and sea salt. Canned and packaged gravies. Worcestershire sauce. Tartar sauce. Barbecue sauce. Teriyaki sauce. Soy sauce, including reduced sodium. Steak sauce. Fish sauce. Oyster sauce. Cocktail sauce. Horseradish that you find on the shelf. Regular ketchup and mustard. Meat flavorings and tenderizers. Bouillon cubes. Hot sauce. Tabasco sauce. Marinades. Taco seasonings. Relishes. Fats and Oils  Regular salad dressings. Salted butter. Margarine. Ghee. Bacon fat. Other  Potato and tortilla chips. Corn chips and puffs. Salted popcorn and pretzels. Canned or dried soups. Pizza. Frozen entrees and pot pies. The items listed above may not be a complete list of foods and beverages to avoid. Contact your dietitian for more information.  This information is not intended to replace advice given to you by your health care provider. Make sure you discuss any questions you have with your health care provider. Document Released: 07/16/2001 Document Revised: 07/02/2015 Document Reviewed: 11/28/2012 Elsevier Interactive Patient Education  2017 Elsevier Inc.  -  Cholesterol Cholesterol is a fat. Your body needs a small amount of cholesterol. Cholesterol (plaque) may build up in your blood vessels (arteries). That makes you more likely to have a heart attack or stroke. You cannot feel your cholesterol level. Having a blood test is the only way to find out if your level is high. Keep your test results. Work with your doctor to keep your cholesterol at a good level. What do the results mean?  Total cholesterol is how much cholesterol is in your blood.  LDL is bad cholesterol. This is the type that can build up. Try to have low LDL.  HDL is good cholesterol. It cleans your blood vessels and carries LDL away. Try to have high HDL.  Triglycerides are fat that the body can store or burn for energy. What are good levels of cholesterol?  Total cholesterol below 200.  LDL below 100 is good for people  who have health risks. LDL below 70 is good for people who have very high risks.  HDL above 40 is good. It is best to have HDL of 60 or higher.  Triglycerides below 150. How can I lower my cholesterol? Diet  Follow your diet program as told by your doctor.  Choose fish, white meat chicken, or [place] that is roasted or baked. Try not to eat  red meat, fried foods, sausage, or lunch meats.  Eat lots of fresh fruits and vegetables.  Choose whole grains, beans, pasta, potatoes, and cereals.  Choose olive oil, corn oil, or canola oil. Only use small amounts.  Try not to eat butter, mayonnaise, shortening, or palm kernel oils.  Try not to eat foods with trans fats.  Choose low-fat or nonfat dairy foods.  Drink skim or nonfat milk.  Eat low-fat or nonfat yogurt and cheeses.  Try not to drink whole milk or cream.  Try not to eat ice cream, egg yolks, or full-fat cheeses.  Healthy desserts include angel food cake, ginger snaps, animal crackers, hard candy, popsicles, and low-fat or nonfat frozen yogurt. Try not to eat pastries, cakes, pies, and cookies. Exercise  Follow your exercise program as told by your doctor.  Be more active. Try gardening, walking, and taking the stairs.  Ask your doctor about ways that you can be more active. Medicine  Take over-the-counter and prescription medicines only as told by your doctor. This information is not intended to replace advice given to you by your health care provider. Make sure you discuss any questions you have with your health care provider. Document Released: 04/22/2008 Document Revised: 08/26/2015 Document Reviewed: 08/06/2015 Elsevier Interactive Patient Education  2017 [place]. Pneumococcal Polysaccharide Vaccine: What You Need to Know 1. Why get vaccinated? Vaccination can protect older adults (and some children and younger adults) from pneumococcal disease. Pneumococcal disease is caused by bacteria that can spread from  person to person through close contact. It can cause ear infections, and it can also lead to more serious infections of the:  Lungs (pneumonia),  Blood (bacteremia), and  Covering of the brain and spinal cord (meningitis). Meningitis can cause deafness and brain damage, and it can be fatal. Anyone can get pneumococcal disease, but children under 18 years of age, people with certain medical conditions, adults over 27 years of age, and cigarette smokers are at the highest risk. About 18,000 older adults die each year from pneumococcal disease in the [place]. Treatment of pneumococcal infections with penicillin and other drugs used to be more effective. But some strains of the disease have become resistant to these drugs. This makes prevention of the disease, through vaccination, even more important. 2. Pneumococcal polysaccharide vaccine (PPSV23) Pneumococcal polysaccharide vaccine (PPSV23) protects against 23 types of pneumococcal bacteria. It will not prevent all pneumococcal disease. PPSV23 is recommended for:  All adults 71 years of age and older,  Anyone 2 through 50 years of age with certain long-term health problems,  Anyone 2 through 50 years of age with a weakened immune system,  Adults 28 through 50 years of age who smoke cigarettes or have asthma. Most people need only one dose of PPSV. A second dose is recommended for certain high-risk groups. People 73 and older should get a dose even if they have gotten one or more doses of the vaccine before they turned 65. Your healthcare provider can give you more information about these recommendations. Most healthy adults develop protection within 2 to 3 weeks of getting the shot. 3. Some people should not get this vaccine  Anyone who has had a life-threatening allergic reaction to PPSV should not get another dose.  Anyone who has a severe allergy to any component of PPSV should not receive it. Tell your provider if you have any  severe allergies.  Anyone who is moderately or severely ill when the shot is scheduled may be asked  to wait until they recover before getting the vaccine. Someone with a mild illness can usually be vaccinated.  Children less than 49 years of age should not receive this vaccine.  There is no evidence that PPSV is harmful to either a pregnant woman or to her fetus. However, as a precaution, women who need the vaccine should be vaccinated before becoming pregnant, if possible. 4. Risks of a vaccine reaction With any medicine, including vaccines, there is a chance of side effects. These are usually mild and go away on their own, but serious reactions are also possible. About half of people who get PPSV have mild side effects, such as redness or pain where the shot is given, which go away within about two days. Less than 1 out of 100 people develop a fever, muscle aches, or more severe local reactions. Problems that could happen after any vaccine:  People sometimes faint after a medical procedure, including vaccination. Sitting or lying down for about 15 minutes can help prevent fainting, and injuries caused by a fall. Tell your doctor if you feel dizzy, or have vision changes or ringing in the ears.  Some people get severe pain in the shoulder and have difficulty moving the arm where a shot was given. This happens very rarely.  Any medication can cause a severe allergic reaction. Such reactions from a vaccine are very rare, estimated at about 1 in a million doses, and would happen within a few minutes to a few hours after the vaccination. As with any medicine, there is a very remote chance of a vaccine causing a serious injury or death. The safety of vaccines is always being monitored. For more information, visit: http://www.aguilar.org/ 5. What if there is a serious reaction? What should I look for? Look for anything that concerns you, such as signs of a severe allergic reaction, very high  fever, or unusual behavior. Signs of a severe allergic reaction can include hives, swelling of the face and throat, difficulty breathing, a fast heartbeat, dizziness, and weakness. These would usually start a few minutes to a few hours after the vaccination. What should I do? If you think it is a severe allergic reaction or other emergency that can't wait, call 9-1-1 or get to the nearest hospital. Otherwise, call your doctor. Afterward, the reaction should be reported to the Vaccine Adverse Event Reporting System (VAERS). Your doctor might file this report, or you can do it yourself through the VAERS web site at www.vaers.SamedayNews.es, or by calling 715-633-1818. VAERS does not give medical advice. 6. How can I learn more?  Ask your doctor. He or she can give you the vaccine package insert or suggest other sources of information.  Call your local or state health department.  Contact the Centers for Disease Control and Prevention (CDC):  Call (704) 313-1770 (1-800-CDC-INFO) or  Visit CDC's website at http://hunter.com/ CDC Pneumococcal Polysaccharide Vaccine VIS (05/31/13) This information is not intended to replace advice given to you by your health care provider. Make sure you discuss any questions you have with your health care provider. Document Released: 11/21/2005 Document Revised: 10/15/2015 Document Reviewed: 10/15/2015 Elsevier Interactive Patient Education  2017 [place] (Tetanus and Diphtheria): What You Need to Know 1. Why get vaccinated? Tetanus  and diphtheria are very serious diseases. They are rare in the [place] today, but people who do become infected often have severe complications. Td vaccine is used to protect adolescents and adults from both of these diseases. Both tetanus and  diphtheria are infections caused by bacteria. Diphtheria spreads from person to person through coughing or sneezing. Tetanus-causing bacteria enter the body through cuts,  scratches, or wounds. TETANUS (lockjaw) causes painful muscle tightening and stiffness, usually all over the body.  It can lead to tightening of muscles in the head and neck so you can't open your mouth, swallow, or sometimes even breathe. Tetanus kills about 1 out of every 10 people who are infected even after receiving the best medical care. DIPHTHERIA can cause a thick coating to form in the back of the throat.  It can lead to breathing problems, paralysis, heart failure, and death. Before vaccines, as many as 200,000 cases of diphtheria and hundreds of cases of tetanus were reported in the [place] each year. Since vaccination began, reports of cases for both diseases have dropped by about 99%. 2. Td vaccine Td vaccine can protect adolescents and adults from tetanus and diphtheria. Td is usually given as a booster dose every 10 years but it can also be given earlier after a severe and dirty wound or burn. Another vaccine, called Tdap, which protects against pertussis in addition to tetanus and diphtheria, is sometimes recommended instead of Td vaccine. Your doctor or the person giving you the vaccine can give you more information. Td may safely be given at the same time as other vaccines. 3. Some people should not get this vaccine  A person who has ever had a life-threatening allergic reaction after a previous dose of any tetanus or diphtheria containing vaccine, OR has a severe allergy to any part of this vaccine, should not get Td vaccine. Tell the person giving the vaccine about any severe allergies.  Talk to your doctor if you:  had severe pain or swelling after any vaccine containing diphtheria or tetanus,  ever had a condition called Guillain Barre Syndrome (GBS),  aren't feeling well on the day the shot is scheduled. 4. What are the risks from Td vaccine? With any medicine, including vaccines, there is a chance of side effects. These are usually mild and go away on their own.  Serious reactions are also possible but are rare. Most people who get Td vaccine do not have any problems with it. Mild problems following Td vaccine: (Did not interfere with activities)  Pain where the shot was given (about 8 people in 10)  Redness or swelling where the shot was given (about 1 person in 4)  Mild fever (rare)  Headache (about 1 person in 4)  Tiredness (about 1 person in 4) Moderate problems following Td vaccine: (Interfered with activities, but did not require medical attention)  Fever over 102F (rare) Severe problems following Td vaccine: (Unable to perform usual activities; required medical attention)  Swelling, severe pain, bleeding and/or redness in the arm where the shot was given (rare). Problems that could happen after any vaccine:  People sometimes faint after a medical procedure, including vaccination. Sitting or lying down for about 15 minutes can help prevent fainting, and injuries caused by a fall. Tell your doctor if you feel dizzy, or have vision changes or ringing in the ears.  Some people get severe pain in the shoulder and have difficulty moving the arm where a shot was given. This happens very rarely.  Any medication can cause a severe allergic reaction. Such reactions from a vaccine are very rare, estimated at fewer than 1 in a million doses, and would happen within a few minutes to a few hours after the vaccination. As  with any medicine, there is a very remote chance of a vaccine causing a serious injury or death. The safety of vaccines is always being monitored. For more information, visit: http://www.aguilar.org/ 5. What if there is a serious reaction? What should I look for? Look for anything that concerns you, such as signs of a severe allergic reaction, very high fever, or unusual behavior. Signs of a severe allergic reaction can include hives, swelling of the face and throat, difficulty breathing, a fast heartbeat, dizziness, and  weakness. These would usually start a few minutes to a few hours after the vaccination. What should I do?  If you think it is a severe allergic reaction or other emergency that can't wait, call 9-1-1 or get the person to the nearest hospital. Otherwise, call your doctor.  Afterward, the reaction should be reported to the Vaccine Adverse Event Reporting System (VAERS). Your doctor might file this report, or you can do it yourself through the VAERS web site at www.vaers.SamedayNews.es, or by calling 309-449-4421.  VAERS does not give medical advice. 6. The National Vaccine Injury Compensation Program The [employer] Vaccine Injury Compensation Program (VICP) is a federal program that was created to compensate people who may have been injured by certain vaccines. Persons who believe they may have been injured by a vaccine can learn about the program and about filing a claim by calling 862-336-3903 or visiting the [place] website at GoldCloset.com.ee. There is a time limit to file a claim for compensation. 7. How can I learn more?  Ask your doctor. He or she can give you the vaccine package insert or suggest other sources of information.  Call your local or state health department.  Contact the Centers for Disease Control and Prevention (CDC):  Call 915-801-0637 (1-800-CDC-INFO)  Visit CDC's website at http://hunter.com/ CDC Td Vaccine VIS (05/19/15) This information is not intended to replace advice given to you by your health care provider. Make sure you discuss any questions you have with your health care provider. Document Released: 11/21/2005 Document Revised: 10/15/2015 Document Reviewed: 10/15/2015 Elsevier Interactive Patient Education  2017 [employer].

## 2016-02-17 NOTE — Progress Notes (Signed)
Stanley Chapman, is a 51 y.o. male  TIR:[id number]  QPY:[id number]  DOB - [birth year]  Chief Complaint  Patient presents with  . Diabetes        Subjective:   Stanley Chapman is a 51 y.o. male here today for a follow up visit of htn/dmv/varicose veins/bph, hx of ventral hernia as well, last seen 4/17, per pt after Thanksgiving his diet was quite bad and not watching what he eats. He denies tob or etoh.  Tries to watch his salt intake as well. Back to taking all his meds renewed by ED on 02/07/16, he was out of metformin for at least 2 wks.  Needs new glucometer.  Use to be on crestor in past, currently not on statin.  Co of intermittent urinary dribbling in am w/ hesitancy. Was unable to tolerate flomax in  2015 due to itching/rash. Unable to tol finasteride rx 4/17 due to similar sxs.  Tries to exercise often.  Intermittent headaches, associated w/ sinus congestion, allergies, denies photophobia/n/v/auras.   Patient has No chest pain, No abdominal pain - No Nausea, No new weakness tingling or numbness, No Cough - SOB.  No problems updated.  ALLERGIES: Allergies  Allergen Reactions  . Flomax [Tamsulosin Hcl]     PAST MEDICAL HISTORY: Past Medical History:  Diagnosis Date  . Constipation   . Diabetes mellitus without complication ([place])   . Hypertension   . Melanosis coli   . Renal lesion     MEDICATIONS AT HOME: Prior to Admission medications   Medication Sig Start Date End Date Taking? Authorizing Provider  Blood Glucose Monitoring Suppl (TRUE METRIX GO GLUCOSE METER) w/Device KIT 1 each by Does not apply route every 8 (eight) hours as needed. 02/17/16   Maren Reamer, MD  esomeprazole (NEXIUM) 20 MG capsule Take 20 mg by mouth daily at 12 noon.    Historical Provider, MD  glucose blood test strip Use as instructed 02/17/16   Maren Reamer, MD  hydrochlorothiazide (HYDRODIURIL) 25 MG tablet Take 1 tablet (25 mg total) by mouth daily. 02/17/16   Maren Reamer, MD  metFORMIN (GLUCOPHAGE) 1000 MG tablet Take 1 tablet (1,000 mg total) by mouth 2 (two) times daily with a meal. 02/17/16   Maren Reamer, MD  pravastatin (PRAVACHOL) 20 MG tablet Take 1 tablet (20 mg total) by mouth daily. 02/17/16   Maren Reamer, MD  tamsulosin (FLOMAX) 0.4 MG CAPS capsule Take 1 capsule (0.4 mg total) by mouth every other day. 02/17/16   Maren Reamer, MD  TRUEPLUS LANCETS 26G MISC 1 each by Does not apply route every 8 (eight) hours as needed. 02/17/16   Maren Reamer, MD     Objective:   Vitals:   02/17/16 1151  BP: 124/85  Pulse: 71  Resp: 16  Temp: 98.4 F (36.9 C)  TempSrc: Oral  SpO2: 100%  Weight: 215 lb (97.5 kg)    Exam General appearance : Awake, alert, not in any distress. Speech Clear. Not toxic looking, pleasant AAM, good spirits. HEENT: Atraumatic and Normocephalic, pupils equally reactive to light. Neck: supple, no JVD. bilat TM clear, no boggy naires/pharyng exudate/erythema noted. Chest:Good air entry bilaterally, no added sounds. CVS: S1 S2 regular, no murmurs/gallups or rubs. Abdomen: Bowel sounds active, Non tender and not distended with no gaurding, rigidity or rebound.  Did not palpate ventral hernia on exam today. Foot exam: bilateral peripheral pulses 2+ (dorsalis pedis and post tibialis pulses), no ulcers noted/no ecchymosis,  warm to touch, monofilament testing 2.5/3 bilat. Slight decrease Sensation bilat.  No c/c/e.  +varicose veins. Neurology: Awake alert, and oriented X 3, CN II-XII grossly intact, Non focal Skin:No Rash  Data Review Lab Results  Component Value Date   HGBA1C 7.9 02/17/2016   HGBA1C 6.8 05/18/2015   HGBA1C 6.10 07/02/2014    Depression screen PHQ 2/9 02/17/2016 05/18/2015  Decreased Interest (No Data) 0  Down, Depressed, Hopeless (No Data) 0  PHQ - 2 Score - 0  Altered sleeping 0 -  Tired, decreased energy 1 -  Change in appetite 0 -  Feeling bad or failure about yourself  (No Data) -    Trouble concentrating (No Data) -  Moving slowly or fidgety/restless (No Data) -  Suicidal thoughts (No Data) -      Assessment & Plan   1. Essential hypertension Controlled, continue hctz 25qd, low salt diet/exercise recd.   2. Type 2 diabetes mellitus with hyperglycemia, without long-term current use of insulin (HCC) Worse control due to diet, lengthy discussion w/  Pt re: diet/exercise. dw pt proper foot care, info provided - increase metformin to 1069m po bid - POCT glucose (manual entry) 158 - POCT glycosylated hemoglobin (Hb A1C) 7.9 (from 6.16 May 2015) - Microalbumin/Creatinine Ratio, Urine - CMP and Liver - CBC with Differential - recd pt has eye exam at optometry, uninsured currently, pt states he knows where he can go that is inexpensive,.  3. Hyperlipidemia, unspecified hyperlipidemia type - start pravastatin 20 qhs - Lipid Panel  4. Benign prostatic hyperplasia with lower urinary tract symptoms, symptom details unspecified Intol of flomax and finasteride in past due to rash /pruritis, renewed flomax to see if he could tol taking it qod.  5. Encounter for screening for HIV - HIV antibody (with reflex)  6. Pneumococcal 23v and tdap today. Pt declined flu vac.  7. Uninsured - pt states he had orange card/cone discount in past and thought he the providers he saw at time was subpar, did not want Obama care since did not think good either, wants to try buying [employer]/etc. - per pt, he has been paying out of pocket for a lot of care - recd pt pick up financial aid packet, talk to financial counselor for more infor,  Any financial aid/insurance is better than w/o.   Patient have been counseled extensively about nutrition and exercise  Return in about 3 months (around 05/17/2016).  The patient was given clear instructions to go to ER or return to medical center if symptoms don't improve, worsen or new problems develop. The patient verbalized understanding. The  patient was told to call to get lab results if they haven't heard anything in the next week.   This note has been created with DSurveyor, quantity Any transcriptional errors are unintentional.   DMaren Reamer MD, [place] [facility] [place]  02/17/2016, 12:33 PM

## 2016-02-22 ENCOUNTER — Telehealth: Payer: Self-pay

## 2016-02-22 NOTE — Telephone Encounter (Signed)
Contacted pt to go over lab results pt states he understands and doesn't understands and doesn't have any questions or concerns

## 2016-03-15 MED FILL — PRAVASTATIN SODIUM 20 MG TA: 20 | 30 days supply | Qty: 30 | Fill #1

## 2016-03-15 MED FILL — TAMSULOSIN HCL 0.4 MG CAP: 0.4 | 30 days supply | Qty: 15 | Fill #1

## 2016-03-15 MED FILL — HYDROCHLOROTHIAZIDE 25 MG T: 25 | 30 days supply | Qty: 30 | Fill #6

## 2016-03-31 MED FILL — FINASTERIDE 5 MG TABLET: 5 | 30 days supply | Qty: 30 | Fill #2

## 2016-03-31 MED FILL — metFORMIN HCL 1000 MG TABS: 1000 | 30 days supply | Qty: 60 | Fill #0

## 2016-04-06 MED FILL — TRUE METRIX TEST STRIP: 33 days supply | Qty: 100 | Fill #1

## 2016-04-20 MED FILL — TRUEplus LANCETS 28G MISC: 33 days supply | Qty: 100 | Fill #1

## 2016-04-20 MED FILL — HYDROCHLOROTHIAZIDE 25 MG T: 25 | 30 days supply | Qty: 30 | Fill #7

## 2016-05-11 MED FILL — metFORMIN HCL 1000 MG TABS: 1000 | 30 days supply | Qty: 60 | Fill #1

## 2016-05-11 MED FILL — PRAVASTATIN NA 20 MG TAB: 20 | 30 days supply | Qty: 30 | Fill #2

## 2016-06-08 MED FILL — TRUEplus LANCETS 28G MISC: 33 days supply | Qty: 100 | Fill #2

## 2016-06-08 MED FILL — TRUE METRIX TEST STRIP: 33 days supply | Qty: 100 | Fill #2

## 2016-06-09 MED FILL — metFORMIN HCL 1000 MG TABS: 1000 | 30 days supply | Qty: 60 | Fill #2

## 2016-06-09 MED FILL — PRAVASTATIN NA 20 MG TAB: 20 | 30 days supply | Qty: 30 | Fill #3

## 2016-06-13 ENCOUNTER — Other Ambulatory Visit: Payer: Self-pay | Admitting: Internal Medicine

## 2016-06-24 MED FILL — HYDROCHLOROTHIAZIDE 25 MG T: 25 | 30 days supply | Qty: 30 | Fill #0

## 2016-07-20 MED FILL — ?METFORMIN HCL 1,000 MG TAB: 1000 | 30 days supply | Qty: 60 | Fill #3

## 2017-01-19 ENCOUNTER — Telehealth: Payer: Self-pay | Admitting: General Practice

## 2017-01-19 NOTE — Telephone Encounter (Signed)
Patient must have office visit for refills. Please schedule appt.

## 2017-01-19 NOTE — Telephone Encounter (Signed)
Pt called to to request a refill for metFORMIN (GLUCOPHAGE) 1000 MG tablet Please sent it to [facility] pharmacy, please follow up

## 2017-01-19 NOTE — Telephone Encounter (Signed)
Called patient and let him know that he needs to be seen for office visit to be re-established. He reports that he ran out of his medication and doesn't know what to do. Dr. Janne Napoleon prescribed only 4 months worth of medication back in January and he says he has been taking a much lower dose and splitting the pills so that is why it has lasted it all year. He does have a reason for not scheduling follow up sooner.   Patient cannot make appt to re-establish care until January 2nd. Asked if he should go to emergency room for refills and I told him that if he felt like he needed to be seen prior to January that urgent care would be better.   Patient requested that I send to head provider for review. Will forward to Dr. Doreene Burke but informed patient that he still may have to be seen before any refills are given.

## 2017-01-20 NOTE — Telephone Encounter (Signed)
Patient needs to be seen to re-establish medical care

## 2017-01-22 ENCOUNTER — Ambulatory Visit (HOSPITAL_COMMUNITY)
Admission: EM | Admit: 2017-01-22 | Discharge: 2017-01-22 | Disposition: A | Discharge status: Home / Self Care | Payer: Self-pay | Attending: Internal Medicine | Admitting: Internal Medicine

## 2017-01-22 ENCOUNTER — Other Ambulatory Visit: Payer: Self-pay

## 2017-01-22 ENCOUNTER — Encounter (HOSPITAL_COMMUNITY): Payer: Self-pay | Admitting: *Deleted

## 2017-01-22 DIAGNOSIS — Z760 Encounter for issue of repeat prescription: Secondary | ICD-10-CM

## 2017-01-22 DIAGNOSIS — E119 Type 2 diabetes mellitus without complications: Secondary | ICD-10-CM

## 2017-01-22 DIAGNOSIS — I10 Essential (primary) hypertension: Secondary | ICD-10-CM

## 2017-01-22 MED ORDER — METFORMIN HCL 1000 MG PO TABS: 1000.0000 mg | ORAL_TABLET | Freq: Two times a day (BID) | ORAL | 1 refills | Status: DC

## 2017-01-22 MED ORDER — FINASTERIDE 5 MG PO TABS: 5.0000 mg | ORAL_TABLET | Freq: Every day | ORAL | 1 refills | Status: AC

## 2017-01-22 MED ORDER — PRAVASTATIN SODIUM 20 MG PO TABS: 20.0000 mg | ORAL_TABLET | Freq: Every day | ORAL | 1 refills | Status: DC

## 2017-01-22 MED ORDER — HYDROCHLOROTHIAZIDE 25 MG PO TABS: 25.0000 mg | ORAL_TABLET | Freq: Every day | ORAL | 1 refills | Status: DC

## 2017-01-22 NOTE — ED Triage Notes (Signed)
Med refills for Metformin 1000 mg BID, Pravastatin NA 20 mg QD, Finasteride 5 mg Qd, HCTZ 25 mg QD,

## 2017-01-22 NOTE — ED Provider Notes (Signed)
[place]    CSN: [id number] Arrival date & time: 01/22/17  1211     History   Chief Complaint Chief Complaint  Patient presents with  . Medication Refill    HPI Stanley Chapman is a 51 y.o. male.   51 year old male, with history of diabetes, hypertension, presenting today for medication refill.  Patient states that he ran out of his metformin, finasteride, hydrochlorothiazide and pravastatin a few days ago.  States that he is unable to be seen by primary care until the beginning of the year.  States that he is only here today for medication refill.  Denies any other complaints   The history is provided by the patient.  Medication Refill  Medications/supplies requested:  Metformin, hydrochlorothiazide, finasteride, pravastatin Reason for request:  Clinic/provider not available and medications ran out Medications taken before: yes - see home medications   Patient has complete original prescription information: yes     Past Medical History:  Diagnosis Date  . Constipation   . Diabetes mellitus without complication ([place])   . Hypertension   . Melanosis coli   . Renal lesion     Patient Active Problem List   Diagnosis Date Noted  . DM (diabetes mellitus) ([place]) 05/14/2013  . HTN (hypertension) 05/14/2013  . Other and unspecified hyperlipidemia 05/14/2013  . RLQ abdominal pain 05/08/2013  . Special screening for malignant neoplasms, colon 05/08/2013    Past Surgical History:  Procedure Laterality Date  . HERNIA REPAIR         Home Medications    Prior to Admission medications   Medication Sig Start Date End Date Taking? Authorizing Provider  Blood Glucose Monitoring Suppl (TRUE METRIX GO GLUCOSE METER) w/Device KIT 1 each by Does not apply route every 8 (eight) hours as needed. 02/17/16   Maren Reamer, MD  esomeprazole (NEXIUM) 20 MG capsule Take 20 mg by mouth daily at 12 noon.    [provider]  finasteride (PROSCAR) 5 MG tablet  Take 1 tablet (5 mg total) by mouth daily. 01/22/17 02/21/17  Blue, Olivia C, PA-C  glucose blood test strip Use as instructed 02/17/16   Lottie Mussel T, MD  hydrochlorothiazide (HYDRODIURIL) 25 MG tablet Take 1 tablet (25 mg total) by mouth daily. 01/22/17   Blue, Olivia C, PA-C  metFORMIN (GLUCOPHAGE) 1000 MG tablet Take 1 tablet (1,000 mg total) by mouth 2 (two) times daily with a meal. 01/22/17   Blue, Olivia C, PA-C  pravastatin (PRAVACHOL) 20 MG tablet Take 1 tablet (20 mg total) by mouth daily. 01/22/17   Blue, Olivia C, PA-C  tamsulosin (FLOMAX) 0.4 MG CAPS capsule Take 1 capsule (0.4 mg total) by mouth every other day. 02/17/16   Langeland, Leda Quail, MD  TRUEPLUS LANCETS 26G MISC 1 each by Does not apply route every 8 (eight) hours as needed. 02/17/16   Maren Reamer, MD    Family History Family History  Problem Relation Age of Onset  . Hypertension Mother   . Diabetes Mother   . Cancer Mother        Not sure of type    Social History Social History   Tobacco Use  . Smoking status: Former Smoker    Types: Cigarettes  . Smokeless tobacco: Never Used  Substance Use Topics  . Alcohol use: No  . Drug use: No    Comment: former marijuana use - x 20 years ago     Allergies   Flomax [tamsulosin hcl]  Review of Systems Review of Systems  Constitutional: Negative for chills and fever.  HENT: Negative for ear pain and sore throat.   Eyes: Negative for pain and visual disturbance.  Respiratory: Negative for cough and shortness of breath.   Cardiovascular: Negative for chest pain and palpitations.  Gastrointestinal: Negative for abdominal pain and vomiting.  Genitourinary: Negative for dysuria and hematuria.  Musculoskeletal: Negative for arthralgias and back pain.  Skin: Negative for color change and rash.  Neurological: Negative for seizures and syncope.  All other systems reviewed and are negative.    Physical Exam Triage Vital Signs ED Triage Vitals  Enc  Vitals Group     BP      Pulse      Resp      Temp      Temp src      SpO2      Weight      Height      Head Circumference      Peak Flow      Pain Score      Pain Loc      Pain Edu?      Excl. in [place]?    No data found.  Updated Vital Signs BP (!) 155/71 (BP Location: Left Arm)   Pulse 64   Temp 98.1 F (36.7 C) (Oral)   SpO2 100%   Visual Acuity Right Eye Distance:   Left Eye Distance:   Bilateral Distance:    Right Eye Near:   Left Eye Near:    Bilateral Near:     Physical Exam  Constitutional: He appears well-developed and well-nourished.  HENT:  Head: Normocephalic and atraumatic.  Eyes: Conjunctivae are normal.  Neck: Neck supple.  Cardiovascular: Normal rate and regular rhythm.  No murmur heard. Pulmonary/Chest: Effort normal and breath sounds normal. No respiratory distress.  Abdominal: Soft. There is no tenderness.  Musculoskeletal: He exhibits no edema.  Neurological: He is alert.  Skin: Skin is warm and dry.  Psychiatric: He has a normal mood and affect.  Nursing note and vitals reviewed.    UC Treatments / Results  Labs (all labs ordered are listed, but only abnormal results are displayed) Labs Reviewed - No data to display  EKG  EKG Interpretation None       Radiology No results found.  Procedures Procedures (including critical care time)  Medications Ordered in UC Medications - No data to display   Initial Impression / Assessment and Plan / UC Course  I have reviewed the triage vital signs and the nursing notes.  Pertinent labs & imaging results that were available during my care of the patient were reviewed by me and considered in my medical decision making (see chart for details).     Med refill only    Final Clinical Impressions(s) / UC Diagnoses   Final diagnoses:  Encounter for medication refill    ED Discharge Orders        Ordered    metFORMIN (GLUCOPHAGE) 1000 MG tablet  2 times daily with meals      01/22/17 1254    hydrochlorothiazide (HYDRODIURIL) 25 MG tablet  Daily    Comments:  Must have office visit for refills   01/22/17 1254    pravastatin (PRAVACHOL) 20 MG tablet  Daily     01/22/17 1254    finasteride (PROSCAR) 5 MG tablet  Daily     01/22/17 1254       Controlled Substance Prescriptions [place] Controlled Substance  Registry consulted? Not Applicable   Phebe Colla, [place] 01/22/17 1307

## 2017-04-09 ENCOUNTER — Emergency Department (HOSPITAL_COMMUNITY)
Admission: EM | Admit: 2017-04-09 | Discharge: 2017-04-09 | Disposition: A | Discharge status: Home / Self Care | Payer: Self-pay | Attending: Emergency Medicine | Admitting: Emergency Medicine

## 2017-04-09 ENCOUNTER — Encounter (HOSPITAL_COMMUNITY): Payer: Self-pay | Admitting: *Deleted

## 2017-04-09 ENCOUNTER — Other Ambulatory Visit: Payer: Self-pay

## 2017-04-09 DIAGNOSIS — N401 Enlarged prostate with lower urinary tract symptoms: Secondary | ICD-10-CM | POA: Insufficient documentation

## 2017-04-09 DIAGNOSIS — I129 Hypertensive chronic kidney disease with stage 1 through stage 4 chronic kidney disease, or unspecified chronic kidney disease: Secondary | ICD-10-CM | POA: Insufficient documentation

## 2017-04-09 DIAGNOSIS — Z87891 Personal history of nicotine dependence: Secondary | ICD-10-CM | POA: Insufficient documentation

## 2017-04-09 DIAGNOSIS — N183 Chronic kidney disease, stage 3 (moderate): Secondary | ICD-10-CM | POA: Insufficient documentation

## 2017-04-09 DIAGNOSIS — R1030 Lower abdominal pain, unspecified: Secondary | ICD-10-CM | POA: Insufficient documentation

## 2017-04-09 DIAGNOSIS — R102 Pelvic and perineal pain: Secondary | ICD-10-CM

## 2017-04-09 DIAGNOSIS — Z79899 Other long term (current) drug therapy: Secondary | ICD-10-CM | POA: Insufficient documentation

## 2017-04-09 DIAGNOSIS — R3914 Feeling of incomplete bladder emptying: Secondary | ICD-10-CM | POA: Insufficient documentation

## 2017-04-09 DIAGNOSIS — E119 Type 2 diabetes mellitus without complications: Secondary | ICD-10-CM | POA: Insufficient documentation

## 2017-04-09 LAB — URINALYSIS, ROUTINE W REFLEX MICROSCOPIC
BILIRUBIN URINE: NEGATIVE
Glucose, UA: NEGATIVE mg/dL
Hgb urine dipstick: NEGATIVE
Ketones, ur: NEGATIVE mg/dL
Leukocytes, UA: NEGATIVE
NITRITE: NEGATIVE
Protein, ur: NEGATIVE mg/dL
SPECIFIC GRAVITY, URINE: 1.03 (ref 1.005–1.030)
pH: 5 (ref 5.0–8.0)

## 2017-04-09 LAB — I-STAT CHEM 8, ED
BUN: 20 mg/dL (ref 6–20)
CALCIUM ION: 1.16 mmol/L (ref 1.15–1.40)
CHLORIDE: 102 mmol/L (ref 101–111)
CREATININE: 1.3 mg/dL — AB (ref 0.61–1.24)
GLUCOSE: 115 mg/dL — AB (ref 65–99)
HCT: 47 % (ref 39.0–52.0)
Hemoglobin: 16 g/dL (ref 13.0–17.0)
Potassium: 3.9 mmol/L (ref 3.5–5.1)
Sodium: 142 mmol/L (ref 135–145)
TCO2: 27 mmol/L (ref 22–32)

## 2017-04-09 MED ORDER — HYDROCHLOROTHIAZIDE 25 MG PO TABS: 25.0000 mg | ORAL_TABLET | Freq: Every day | ORAL | 0 refills | Status: DC

## 2017-04-09 MED ORDER — TAMSULOSIN HCL 0.4 MG PO CAPS: 0.4000 mg | ORAL_CAPSULE | Freq: Every day | ORAL | 0 refills | Status: DC

## 2017-04-09 MED ORDER — METFORMIN HCL 1000 MG PO TABS: 1000.0000 mg | ORAL_TABLET | Freq: Two times a day (BID) | ORAL | 0 refills | Status: DC

## 2017-04-09 NOTE — Discharge Instructions (Addendum)
Take Flomax and Finasteride together for prostate issues Take 1 capsule of Flomax once daily for two weeks. If your symptoms are not improving after 2 weeks, you can take 2 capsules once a day Please call [facility]'s office to make a follow up appointment Also please establish care with a primary doctor Return if worsening

## 2017-04-09 NOTE — ED Triage Notes (Signed)
Pt reports only able to urinate small amounts of urine over past 2 days. Now has abd discomfort and feels bloated. States he recently had pain and burning with urination.

## 2017-04-09 NOTE — ED Provider Notes (Signed)
[facility] EMERGENCY DEPARTMENT Provider Note   CSN: [id number] Arrival date & time: 04/09/17  1451     History   Chief Complaint Chief Complaint  Patient presents with  . Urinary Retention    HPI Stanley Chapman is a 52 y.o. male who presents with urinary retention. PMH significant for Type 2 DM, hypertension, HLD, CKD stage 3, BPH, chronic constipation.  He states that he takes finasteride for an enlarged prostate.  He has previously taken Flomax but stopped this due to possible mild allergy (itching and dry skin). He does have issues with urinary frequency normally due to his prostate however over the past 2 days he has not been able to urinate.  He states he will has the urge to urinate but when he goes only a small amount will come out and he feels like he has not emptied his bladder.  He feels a feeling of fullness and swelling over the suprapubic area.  He is not sexually active   Last time was a year ago.  He has not had any penis or testicular pain.  He does have some pain when he is trying to urinate.  He has not had this problem before. He has been taking Claritin-D for sinus issues recently. He has also been taking an OTC supplement for prostate health.  Otherwise no new medications. He denies fever, chills, flank pain, vomiting, hematuria, recent constipation.    HPI  Past Medical History:  Diagnosis Date  . Constipation   . Diabetes mellitus without complication ([place])   . Hypertension   . Melanosis coli   . Renal lesion     Patient Active Problem List   Diagnosis Date Noted  . DM (diabetes mellitus) ([place]) 05/14/2013  . HTN (hypertension) 05/14/2013  . Other and unspecified hyperlipidemia 05/14/2013  . RLQ abdominal pain 05/08/2013  . Special screening for malignant neoplasms, colon 05/08/2013    Past Surgical History:  Procedure Laterality Date  . HERNIA REPAIR         Home Medications    Prior to Admission medications   Medication  Sig Start Date End Date Taking? Authorizing Provider  Blood Glucose Monitoring Suppl (TRUE METRIX GO GLUCOSE METER) w/Device KIT 1 each by Does not apply route every 8 (eight) hours as needed. 02/17/16   Maren Reamer, MD  esomeprazole (NEXIUM) 20 MG capsule Take 20 mg by mouth daily at 12 noon.    [provider]  glucose blood test strip Use as instructed 02/17/16   Lottie Mussel T, MD  hydrochlorothiazide (HYDRODIURIL) 25 MG tablet Take 1 tablet (25 mg total) by mouth daily. 01/22/17   Blue, Olivia C, PA-C  metFORMIN (GLUCOPHAGE) 1000 MG tablet Take 1 tablet (1,000 mg total) by mouth 2 (two) times daily with a meal. 01/22/17   Blue, Olivia C, PA-C  pravastatin (PRAVACHOL) 20 MG tablet Take 1 tablet (20 mg total) by mouth daily. 01/22/17   Blue, Olivia C, PA-C  tamsulosin (FLOMAX) 0.4 MG CAPS capsule Take 1 capsule (0.4 mg total) by mouth every other day. 02/17/16   Langeland, Leda Quail, MD  TRUEPLUS LANCETS 26G MISC 1 each by Does not apply route every 8 (eight) hours as needed. 02/17/16   Maren Reamer, MD    Family History Family History  Problem Relation Age of Onset  . Hypertension Mother   . Diabetes Mother   . Cancer Mother        Not sure of type  Social History Social History   Tobacco Use  . Smoking status: Former Smoker    Types: Cigarettes  . Smokeless tobacco: Never Used  Substance Use Topics  . Alcohol use: No  . Drug use: No    Comment: former marijuana use - x 20 years ago     Allergies   Flomax [tamsulosin hcl]   Review of Systems Review of Systems  Constitutional: Negative for chills and fever.  Respiratory: Negative for shortness of breath.   Cardiovascular: Negative for chest pain.  Gastrointestinal: Positive for abdominal pain (suprapubic pressure) and constipation (chronic). Negative for nausea and vomiting.  Genitourinary: Positive for difficulty urinating and dysuria. Negative for discharge, flank pain, hematuria, penile pain and  testicular pain.  All other systems reviewed and are negative.    Physical Exam Updated Vital Signs BP (!) 147/94 (BP Location: Right Arm)   Pulse 98   Temp 99.2 F (37.3 C) (Oral)   Resp 17   SpO2 100%   Physical Exam  Constitutional: He is oriented to person, place, and time. He appears well-developed and well-nourished. No distress.  HENT:  Head: Normocephalic and atraumatic.  Eyes: Conjunctivae are normal. Pupils are equal, round, and reactive to light. Right eye exhibits no discharge. Left eye exhibits no discharge. No scleral icterus.  Neck: Normal range of motion.  Cardiovascular: Normal rate and regular rhythm.  Pulmonary/Chest: Effort normal and breath sounds normal. No respiratory distress.  Abdominal: Soft. Bowel sounds are normal. He exhibits no distension. There is tenderness (suprapubic). A hernia (umbilical) is present.  Neurological: He is alert and oriented to person, place, and time.  Skin: Skin is warm and dry.  Psychiatric: He has a normal mood and affect. His behavior is normal.  Nursing note and vitals reviewed.    ED Treatments / Results  Labs (all labs ordered are listed, but only abnormal results are displayed) Labs Reviewed  URINALYSIS, ROUTINE W REFLEX MICROSCOPIC - Abnormal; Notable for the following components:      Result Value   APPearance HAZY (*)    All other components within normal limits  I-STAT CHEM 8, ED - Abnormal; Notable for the following components:   Creatinine, Ser 1.30 (*)    Glucose, Bld 115 (*)    All other components within normal limits    EKG  EKG Interpretation None       Radiology No results found.  Procedures Procedures (including critical care time)  Medications Ordered in ED Medications - No data to display   Initial Impression / Assessment and Plan / ED Course  I have reviewed the triage vital signs and the nursing notes.  Pertinent labs & imaging results that were available during my care of the  patient were reviewed by me and considered in my medical decision making (see chart for details).  52 year old male presents with suprapubic discomfort and feeling of not being able to empty his bladder. He is mildly hypertensive but otherwise vitals are normal. Exam is remarkable for mild suprapubic tenderness. UA is clean. Chem 8 shows creatinine is 1.3 which is around his baseline. Bladder scan revealed ~180cc of urine. A foley was placed and about 200 cc of urine was drained. The patient reported relief of symptoms. Shared decision making was made whether or not to keep the foley inserted. He opted to have it removed and to try to restart Flomax. He was given a referral to Urology. He was also given a referral for a PCP and  he asked for a refill of his Metformin and HCTZ which was provided. Return precautions were given.  Final Clinical Impressions(s) / ED Diagnoses   Final diagnoses:  Suprapubic pressure  Benign prostatic hyperplasia with incomplete bladder emptying    ED Discharge Orders    None       Recardo Evangelist, PA-C 04/09/17 2057    Lajean Saver, MD 04/09/17 2132

## 2017-05-14 ENCOUNTER — Ambulatory Visit (HOSPITAL_COMMUNITY)
Admission: EM | Admit: 2017-05-14 | Discharge: 2017-05-14 | Disposition: A | Discharge status: Home / Self Care | Payer: Self-pay | Attending: Internal Medicine | Admitting: Internal Medicine

## 2017-05-14 ENCOUNTER — Encounter (HOSPITAL_COMMUNITY): Payer: Self-pay | Admitting: *Deleted

## 2017-05-14 DIAGNOSIS — E119 Type 2 diabetes mellitus without complications: Secondary | ICD-10-CM

## 2017-05-14 DIAGNOSIS — Z760 Encounter for issue of repeat prescription: Secondary | ICD-10-CM

## 2017-05-14 DIAGNOSIS — I10 Essential (primary) hypertension: Secondary | ICD-10-CM

## 2017-05-14 HISTORY — DX: Umbilical hernia without obstruction or gangrene: K42.9

## 2017-05-14 MED ORDER — TAMSULOSIN HCL 0.4 MG PO CAPS: 0.4000 mg | ORAL_CAPSULE | Freq: Every day | ORAL | 0 refills | Status: DC

## 2017-05-14 MED ORDER — FINASTERIDE 5 MG PO TABS: 5.0000 mg | ORAL_TABLET | Freq: Every day | ORAL | 0 refills | Status: DC

## 2017-05-14 NOTE — ED Triage Notes (Signed)
Pt reports needing refill for tamsulosin - ran out approx 1-2 wks ago.  Also states needs Rx for another med, but cannot remember name, and it is not listed on med hx.

## 2017-05-14 NOTE — Discharge Instructions (Signed)
Please restart your medications I have refilled both.  Please follow up with urology when you regain your insurance  Return if you develop abdominal pain/discomfort.

## 2017-05-14 NOTE — ED Provider Notes (Signed)
[facility]    CSN: [id number] Arrival date & time: 05/14/17  1227     History   Chief Complaint Chief Complaint  Patient presents with  . Medication Refill    HPI Stanley Chapman is a 52 y.o. male history of diabetes type 2, hypertension, BPH presenting today for refill of medication.  States he is out of his tamsulosin and finasteride.  Since he has noticed an increase in frequency, waking up 4-5 times a night to urinate, no decrease in stream and more dribbling of urine.  He is not having any abdominal pain or suprapubic discomfort.  He has not seen a urologist in approximately 2 years, but [facility].  He is trying to cut down on caffeine as this worsens his symptoms.  Sitting a TURP.  HPI  Past Medical History:  Diagnosis Date  . Constipation   . Diabetes mellitus without complication ([place])   . Hernia, umbilical   . Hypertension   . Melanosis coli   . Renal lesion     Patient Active Problem List   Diagnosis Date Noted  . DM (diabetes mellitus) ([place]) 05/14/2013  . HTN (hypertension) 05/14/2013  . Other and unspecified hyperlipidemia 05/14/2013  . RLQ abdominal pain 05/08/2013  . Special screening for malignant neoplasms, colon 05/08/2013    History reviewed. No pertinent surgical history.     Home Medications    Prior to Admission medications   Medication Sig Start Date End Date Taking? Authorizing Provider  esomeprazole (NEXIUM) 20 MG capsule Take 20 mg by mouth daily at 12 noon.   Yes [provider]  metFORMIN (GLUCOPHAGE) 1000 MG tablet Take 1 tablet (1,000 mg total) by mouth 2 (two) times daily. 04/09/17  Yes Recardo Evangelist, PA-C  Blood Glucose Monitoring Suppl (TRUE METRIX GO GLUCOSE METER) w/Device KIT 1 each by Does not apply route every 8 (eight) hours as needed. 02/17/16   Maren Reamer, MD  finasteride (PROSCAR) 5 MG tablet Take 1 tablet (5 mg total) by mouth daily. 05/14/17    Jemia Fata C, PA-C  glucose blood test strip Use as instructed 02/17/16   Lottie Mussel T, MD  hydrochlorothiazide (HYDRODIURIL) 25 MG tablet Take 1 tablet (25 mg total) by mouth daily. 04/09/17   Recardo Evangelist, PA-C  pravastatin (PRAVACHOL) 20 MG tablet Take 1 tablet (20 mg total) by mouth daily. 01/22/17   Blue, Olivia C, PA-C  tamsulosin (FLOMAX) 0.4 MG CAPS capsule Take 1 capsule (0.4 mg total) by mouth daily. 05/14/17   Kinsleigh Ludolph C, PA-C  TRUEPLUS LANCETS 26G MISC 1 each by Does not apply route every 8 (eight) hours as needed. 02/17/16   Maren Reamer, MD    Family History Family History  Problem Relation Age of Onset  . Hypertension Mother   . Diabetes Mother   . Cancer Mother        Not sure of type    Social History Social History   Tobacco Use  . Smoking status: Former Smoker    Types: Cigarettes  . Smokeless tobacco: Never Used  Substance Use Topics  . Alcohol use: No  . Drug use: No    Comment: former marijuana use - x 20 years ago     Allergies   Patient has no active allergies.   Review of Systems Review of Systems  Constitutional: Negative for fatigue and fever.  Respiratory: Negative for shortness of breath.  Cardiovascular: Negative for chest pain.  Gastrointestinal: Negative for nausea and vomiting.  Genitourinary: Positive for difficulty urinating and frequency. Negative for decreased urine volume, discharge, dysuria, hematuria, penile pain, penile swelling and urgency.  Skin: Negative for wound.     Physical Exam Triage Vital Signs ED Triage Vitals [05/14/17 1326]  Enc Vitals Group     BP 130/78     Pulse Rate 75     Resp 16     Temp 97.7 F (36.5 C)     Temp Source Oral     SpO2 100 %     Weight      Height      Head Circumference      Peak Flow      Pain Score      Pain Loc      Pain Edu?      Excl. in [place]?    No data found.  Updated Vital Signs BP 130/78   Pulse 75   Temp 97.7 F (36.5 C) (Oral)   Resp 16    SpO2 100%   Visual Acuity Right Eye Distance:   Left Eye Distance:   Bilateral Distance:    Right Eye Near:   Left Eye Near:    Bilateral Near:     Physical Exam  Constitutional: He appears well-developed and well-nourished.  HENT:  Head: Normocephalic and atraumatic.  Eyes: Conjunctivae are normal.  Neck: Neck supple.  Cardiovascular: Normal rate and regular rhythm.  No murmur heard. Pulmonary/Chest: Effort normal and breath sounds normal. No respiratory distress.  Abdominal: Soft. There is no tenderness.  Musculoskeletal: He exhibits no edema.  Neurological: He is alert.  Skin: Skin is warm and dry.  Psychiatric: He has a normal mood and affect.  Nursing note and vitals reviewed.    UC Treatments / Results  Labs (all labs ordered are listed, but only abnormal results are displayed) Labs Reviewed - No data to display  EKG None Radiology No results found.  Procedures Procedures (including critical care time)  Medications Ordered in UC Medications - No data to display   Initial Impression / Assessment and Plan / UC Course  I have reviewed the triage vital signs and the nursing notes.  Pertinent labs & imaging results that were available during my care of the patient were reviewed by me and considered in my medical decision making (see chart for details).     Patient with history of BPH, worsening obstructive urinary symptoms.  Will refill tamsulosin and finasteride.  Advised to follow-up with urology since it has been a while. Discussed strict return precautions. Patient verbalized understanding and is agreeable with plan.   Final Clinical Impressions(s) / UC Diagnoses   Final diagnoses:  Medication refill    ED Discharge Orders        Ordered    tamsulosin (FLOMAX) 0.4 MG CAPS capsule  Daily,   Status:  Discontinued     05/14/17 1415    finasteride (PROSCAR) 5 MG tablet  Daily     05/14/17 1415    tamsulosin (FLOMAX) 0.4 MG CAPS capsule  Daily       05/14/17 1422       Controlled Substance Prescriptions [place] Controlled Substance Registry consulted? Not Applicable   Janith Lima, [place] 05/14/17 1425

## 2017-06-11 ENCOUNTER — Ambulatory Visit (HOSPITAL_COMMUNITY)
Admission: EM | Admit: 2017-06-11 | Discharge: 2017-06-11 | Disposition: A | Discharge status: Home / Self Care | Payer: Self-pay | Attending: Emergency Medicine | Admitting: Emergency Medicine

## 2017-06-11 ENCOUNTER — Encounter (HOSPITAL_COMMUNITY): Payer: Self-pay | Admitting: Emergency Medicine

## 2017-06-11 DIAGNOSIS — Z809 Family history of malignant neoplasm, unspecified: Secondary | ICD-10-CM | POA: Insufficient documentation

## 2017-06-11 DIAGNOSIS — Z760 Encounter for issue of repeat prescription: Secondary | ICD-10-CM | POA: Insufficient documentation

## 2017-06-11 DIAGNOSIS — Z833 Family history of diabetes mellitus: Secondary | ICD-10-CM | POA: Insufficient documentation

## 2017-06-11 DIAGNOSIS — Z79899 Other long term (current) drug therapy: Secondary | ICD-10-CM | POA: Insufficient documentation

## 2017-06-11 DIAGNOSIS — E785 Hyperlipidemia, unspecified: Secondary | ICD-10-CM | POA: Insufficient documentation

## 2017-06-11 DIAGNOSIS — I10 Essential (primary) hypertension: Secondary | ICD-10-CM | POA: Insufficient documentation

## 2017-06-11 DIAGNOSIS — E119 Type 2 diabetes mellitus without complications: Secondary | ICD-10-CM

## 2017-06-11 DIAGNOSIS — E118 Type 2 diabetes mellitus with unspecified complications: Secondary | ICD-10-CM | POA: Insufficient documentation

## 2017-06-11 DIAGNOSIS — Z7984 Long term (current) use of oral hypoglycemic drugs: Secondary | ICD-10-CM | POA: Insufficient documentation

## 2017-06-11 DIAGNOSIS — Z87891 Personal history of nicotine dependence: Secondary | ICD-10-CM | POA: Insufficient documentation

## 2017-06-11 DIAGNOSIS — Z8249 Family history of ischemic heart disease and other diseases of the circulatory system: Secondary | ICD-10-CM | POA: Insufficient documentation

## 2017-06-11 LAB — HEMOGLOBIN A1C
Hgb A1c MFr Bld: 6.2 % — ABNORMAL HIGH (ref 4.8–5.6)
MEAN PLASMA GLUCOSE: 131.24 mg/dL

## 2017-06-11 MED ORDER — METFORMIN HCL 1000 MG PO TABS: 1000.0000 mg | ORAL_TABLET | Freq: Two times a day (BID) | ORAL | 0 refills | Status: DC

## 2017-06-11 MED ORDER — GLUCOSE BLOOD VI STRP: ORAL_STRIP | 12 refills | Status: DC

## 2017-06-11 MED ORDER — TRUEPLUS LANCETS 26G MISC: 1.0000 | Freq: Three times a day (TID) | 12 refills | Status: DC | PRN

## 2017-06-11 NOTE — ED Triage Notes (Signed)
Pt here for refill of metformin.

## 2017-06-11 NOTE — ED Provider Notes (Signed)
[facility]    CSN: [id number] Arrival date & time: 06/11/17  1206     History   Chief Complaint Chief Complaint  Patient presents with  . Medication Refill    HPI Stanley Chapman is a 52 y.o. male history of type 2 diabetes, hypertension, hyperlipidemia presenting today for evaluation for medication refill.  He is requesting that his metformin be refilled.  [facility], but his last visit was January 2018.  Last A1c at this time was 7.9.  He has been seen here multiple times for medication refills in the meantime.  He states that he does check his blood sugar at home, on a typical day runs around 90 or 100 in the morning, but this morning it was in the 160s.  He denies any nausea, vomiting, dysuria.  HPI  Past Medical History:  Diagnosis Date  . Constipation   . Diabetes mellitus without complication ([place])   . Hernia, umbilical   . Hypertension   . Melanosis coli   . Renal lesion     Patient Active Problem List   Diagnosis Date Noted  . DM (diabetes mellitus) ([place]) 05/14/2013  . HTN (hypertension) 05/14/2013  . Other and unspecified hyperlipidemia 05/14/2013  . RLQ abdominal pain 05/08/2013  . Special screening for malignant neoplasms, colon 05/08/2013    History reviewed. No pertinent surgical history.     Home Medications    Prior to Admission medications   Medication Sig Start Date End Date Taking? Authorizing Provider  Blood Glucose Monitoring Suppl (TRUE METRIX GO GLUCOSE METER) w/Device KIT 1 each by Does not apply route every 8 (eight) hours as needed. 02/17/16   Maren Reamer, MD  esomeprazole (NEXIUM) 20 MG capsule Take 20 mg by mouth daily at 12 noon.    [provider]  finasteride (PROSCAR) 5 MG tablet Take 1 tablet (5 mg total) by mouth daily. 05/14/17   Petrina Melby C, PA-C  glucose blood test strip Use as instructed 06/11/17   Zlaty Alexa C, PA-C    hydrochlorothiazide (HYDRODIURIL) 25 MG tablet Take 1 tablet (25 mg total) by mouth daily. 04/09/17   Recardo Evangelist, PA-C  metFORMIN (GLUCOPHAGE) 1000 MG tablet Take 1 tablet (1,000 mg total) by mouth 2 (two) times daily. 06/11/17   Kalvin Buss C, PA-C  pravastatin (PRAVACHOL) 20 MG tablet Take 1 tablet (20 mg total) by mouth daily. 01/22/17   Blue, Olivia C, PA-C  tamsulosin (FLOMAX) 0.4 MG CAPS capsule Take 1 capsule (0.4 mg total) by mouth daily. 05/14/17   Mikaia Janvier C, PA-C  TRUEPLUS LANCETS 26G MISC 1 each by Does not apply route every 8 (eight) hours as needed. 06/11/17   Reegan Bouffard, Elesa Hacker, PA-C    Family History Family History  Problem Relation Age of Onset  . Hypertension Mother   . Diabetes Mother   . Cancer Mother        Not sure of type    Social History Social History   Tobacco Use  . Smoking status: Former Smoker    Types: Cigarettes  . Smokeless tobacco: Never Used  Substance Use Topics  . Alcohol use: No  . Drug use: No    Comment: former marijuana use - x 20 years ago     Allergies   Patient has no known allergies.   Review of Systems Review of Systems  Constitutional: Negative for chills and fever.  Eyes:  Negative for pain and visual disturbance.  Respiratory: Negative for shortness of breath.   Cardiovascular: Negative for chest pain.  Gastrointestinal: Negative for abdominal pain, nausea and vomiting.  Genitourinary: Negative for dysuria and hematuria.  Musculoskeletal: Negative for myalgias.  Neurological: Negative for dizziness, syncope, light-headedness, numbness and headaches.  All other systems reviewed and are negative.    Physical Exam Triage Vital Signs ED Triage Vitals  Enc Vitals Group     BP 06/11/17 1301 (!) 145/85     Pulse Rate 06/11/17 1301 65     Resp 06/11/17 1301 16     Temp 06/11/17 1301 98.6 F (37 C)     Temp Source 06/11/17 1301 Oral     SpO2 06/11/17 1301 100 %     Weight --      Height --      Head  Circumference --      Peak Flow --      Pain Score 06/11/17 1306 0     Pain Loc --      Pain Edu? --      Excl. in [place]? --    No data found.  Updated Vital Signs BP (!) 145/85 (BP Location: Right Arm)   Pulse 65   Temp 98.6 F (37 C) (Oral)   Resp 16   SpO2 100%   Visual Acuity Right Eye Distance:   Left Eye Distance:   Bilateral Distance:    Right Eye Near:   Left Eye Near:    Bilateral Near:     Physical Exam  Constitutional: He appears well-developed and well-nourished.  HENT:  Head: Normocephalic and atraumatic.  Eyes: Conjunctivae are normal.  Neck: Neck supple.  Cardiovascular: Normal rate and regular rhythm.  No murmur heard. Pulmonary/Chest: Effort normal. No respiratory distress.  Abdominal: Soft. There is no tenderness.  Musculoskeletal: He exhibits no edema.  Neurological: He is alert.  Skin: Skin is warm and dry.  Psychiatric: He has a normal mood and affect.  Nursing note and vitals reviewed.    UC Treatments / Results  Labs (all labs ordered are listed, but only abnormal results are displayed) Labs Reviewed  HEMOGLOBIN A1C    EKG None  Radiology No results found.  Procedures Procedures (including critical care time)  Medications Ordered in UC Medications - No data to display  Initial Impression / Assessment and Plan / UC Course  I have reviewed the triage vital signs and the nursing notes.  Pertinent labs & imaging results that were available during my care of the patient were reviewed by me and considered in my medical decision making (see chart for details).     Discussed with patient about [facility] for reevaluation of diabetes and need for altered medicine skin especially given his blood sugar was in the 160s today.  We will go ahead and recheck A1c so that this is available to [facility] when he follows up.  Provided 1 month of metformin.  Also discussed with patient about  lifestyle modifications.  Refilled strips and lancets and discussed monitoring at home.  Discussed strict return precautions. Patient verbalized understanding and is agreeable with plan.  Final Clinical Impressions(s) / UC Diagnoses   Final diagnoses:  Medication refill  Type 2 diabetes mellitus with complication, without long-term current use of insulin [facility])     Discharge Instructions     I have refilled your metformin for 1 month  Call [facility]  to follow up for a recheck of your diabetes/cholesterol  Continue to monitor your sugars at home   ED Prescriptions    Medication Sig Dispense Auth. Provider   metFORMIN (GLUCOPHAGE) 1000 MG tablet  (Status: Discontinued) Take 1 tablet (1,000 mg total) by mouth 2 (two) times daily. 60 tablet Yassir Enis C, PA-C   glucose blood test strip Use as instructed 100 each Casimir Barcellos C, PA-C   TRUEPLUS LANCETS 26G MISC 1 each by Does not apply route every 8 (eight) hours as needed. 100 each Tereso Unangst C, PA-C   metFORMIN (GLUCOPHAGE) 1000 MG tablet Take 1 tablet (1,000 mg total) by mouth 2 (two) times daily. 60 tablet Raia Amico, [place] C, PA-C     Controlled Substance Prescriptions [place] Controlled Substance Registry consulted? Not Applicable   Janith Lima, [place] 06/11/17 1340

## 2017-06-11 NOTE — Discharge Instructions (Signed)
I have refilled your metformin for 1 month  Call [facility] to follow up for a recheck of your diabetes/cholesterol  Continue to monitor your sugars at home

## 2017-07-14 ENCOUNTER — Ambulatory Visit: Payer: Self-pay | Attending: Internal Medicine | Admitting: Internal Medicine

## 2017-07-14 ENCOUNTER — Encounter: Payer: Self-pay | Admitting: Internal Medicine

## 2017-07-14 VITALS — BP 140/86 | HR 67 | Temp 98.2°F | Resp 16 | Ht 71.5 in | Wt 211.4 lb

## 2017-07-14 DIAGNOSIS — I8392 Asymptomatic varicose veins of left lower extremity: Secondary | ICD-10-CM | POA: Insufficient documentation

## 2017-07-14 DIAGNOSIS — Z79899 Other long term (current) drug therapy: Secondary | ICD-10-CM | POA: Insufficient documentation

## 2017-07-14 DIAGNOSIS — E785 Hyperlipidemia, unspecified: Secondary | ICD-10-CM | POA: Insufficient documentation

## 2017-07-14 DIAGNOSIS — Z7984 Long term (current) use of oral hypoglycemic drugs: Secondary | ICD-10-CM | POA: Insufficient documentation

## 2017-07-14 DIAGNOSIS — K439 Ventral hernia without obstruction or gangrene: Secondary | ICD-10-CM | POA: Insufficient documentation

## 2017-07-14 DIAGNOSIS — I10 Essential (primary) hypertension: Secondary | ICD-10-CM | POA: Insufficient documentation

## 2017-07-14 DIAGNOSIS — E119 Type 2 diabetes mellitus without complications: Secondary | ICD-10-CM | POA: Insufficient documentation

## 2017-07-14 DIAGNOSIS — Z87891 Personal history of nicotine dependence: Secondary | ICD-10-CM | POA: Insufficient documentation

## 2017-07-14 DIAGNOSIS — Z8249 Family history of ischemic heart disease and other diseases of the circulatory system: Secondary | ICD-10-CM | POA: Insufficient documentation

## 2017-07-14 DIAGNOSIS — E1165 Type 2 diabetes mellitus with hyperglycemia: Secondary | ICD-10-CM

## 2017-07-14 DIAGNOSIS — N401 Enlarged prostate with lower urinary tract symptoms: Secondary | ICD-10-CM | POA: Insufficient documentation

## 2017-07-14 LAB — GLUCOSE, POCT (MANUAL RESULT ENTRY): POC Glucose: 95 mg/dl (ref 70–99)

## 2017-07-14 MED ORDER — METFORMIN HCL 1000 MG PO TABS: 1000.0000 mg | ORAL_TABLET | Freq: Two times a day (BID) | ORAL | 6 refills | Status: DC

## 2017-07-14 MED ORDER — DOXAZOSIN MESYLATE 2 MG PO TABS: 1.0000 mg | ORAL_TABLET | Freq: Every day | ORAL | 6 refills | Status: DC

## 2017-07-14 MED ORDER — PRAVASTATIN SODIUM 20 MG PO TABS: 20.0000 mg | ORAL_TABLET | Freq: Every day | ORAL | 1 refills | Status: DC

## 2017-07-14 MED ORDER — TRUEPLUS LANCETS 26G MISC: 1.0000 | Freq: Three times a day (TID) | 12 refills | Status: DC | PRN

## 2017-07-14 MED FILL — TRUEplus LANCETS 28G MISC: 30 days supply | Qty: 100 | Fill #0

## 2017-07-14 MED FILL — PRAVASTATIN SODIUM 20 MG TA: 20 | 90 days supply | Qty: 90 | Fill #0

## 2017-07-14 MED FILL — DOXAZOSIN MESYLATE 2 MG TAB: 2 | 30 days supply | Qty: 15 | Fill #0

## 2017-07-14 MED FILL — TRUE METRIX TEST STRIP: 30 days supply | Qty: 100 | Fill #0

## 2017-07-14 MED FILL — metFORMIN HCL 1000 MG TABS: 1000 | 30 days supply | Qty: 60 | Fill #0

## 2017-07-14 NOTE — Progress Notes (Signed)
Patient ID: MAGDIEL BARTLES, male    DOB: [birth year]  MRN: [id number]  CC: re-establish; Diabetes; and Hypertension   Subjective: Stanley Chapman is a 52 y.o. male who presents for chronic ds management and to est with me as PCP. His concerns today include:  Pt with hx of HTN, DM, BPH, varicose veins, HL, ventral hernia  HL:  He was not taking the Pravachol consistently; filled it about 1-2 x.  States he was told that he was put on it because he has diabetes.  Last lipid profile shows LDL greater than 100.  He was also concern about possible S.E of statins affecting liver.  DM:  Reports compliance with Metformin.  Eating habits: "I've done great before.  I know I can do it but I got a little out of hand again."  Admits to eating sweets sometimes and more starches Drinking more water over past 2 wks. Exercise:  Working 2 jobs 5 days a wk.  Leaves home at 6 a.m and gets home at 2 a.m so not much time for exercise.  However he does a lot of walking and bending at work.   HTN:  Out of HCTZ x a few mths.  States he was placed on it due to LE edema.  But no LE edema in a while.  BPH:  Gets itching when he takes Flomax.  Stopped Finasteride because he read it can caused enlarge breast. He does have problems passing urine.  Has to strain to get urine started. Never feels like he completely empties.  Symptoms abate when he takes Flomax but not taking consistently because of the itching.  Patient Active Problem List   Diagnosis Date Noted  . DM (diabetes mellitus) ([place]) 05/14/2013  . HTN (hypertension) 05/14/2013  . Other and unspecified hyperlipidemia 05/14/2013  . RLQ abdominal pain 05/08/2013  . Special screening for malignant neoplasms, colon 05/08/2013     Current Outpatient Medications on File Prior to Visit  Medication Sig Dispense Refill  . Blood Glucose Monitoring Suppl (TRUE METRIX GO GLUCOSE METER) w/Device KIT 1 each by Does not apply route every 8 (eight) hours as needed. 1  kit 0  . esomeprazole (NEXIUM) 20 MG capsule Take 20 mg by mouth daily at 12 noon.    Marland Kitchen glucose blood test strip Use as instructed 100 each 12   No current facility-administered medications on file prior to visit.     Allergies  Allergen Reactions  . Flomax [Tamsulosin Hcl] Itching    Social History   Socioeconomic History  . Marital status: Divorced    Spouse name: Not on file  . Number of children: Not on file  . Years of education: Not on file  . Highest education level: Not on file  Occupational History  . Not on file  Social Needs  . Financial resource strain: Not on file  . Food insecurity:    Worry: Not on file    Inability: Not on file  . Transportation needs:    Medical: Not on file    Non-medical: Not on file  Tobacco Use  . Smoking status: Former Smoker    Types: Cigarettes  . Smokeless tobacco: Never Used  Substance and Sexual Activity  . Alcohol use: No  . Drug use: No    Comment: former marijuana use - x 20 years ago  . Sexual activity: Not on file  Lifestyle  . Physical activity:    Days per week: Not on file  Minutes per session: Not on file  . Stress: Not on file  Relationships  . Social connections:    Talks on phone: Not on file    Gets together: Not on file    Attends religious service: Not on file    Active member of club or organization: Not on file    Attends meetings of clubs or organizations: Not on file    Relationship status: Not on file  . Intimate partner violence:    Fear of current or ex partner: Not on file    Emotionally abused: Not on file    Physically abused: Not on file    Forced sexual activity: Not on file  Other Topics Concern  . Not on file  Social History Narrative  . Not on file    Family History  Problem Relation Age of Onset  . Hypertension Mother   . Diabetes Mother   . Cancer Mother        Not sure of type    No past surgical history on file.  ROS: Review of Systems Neg except as stated  above PHYSICAL EXAM: BP 140/86   Pulse 67   Temp 98.2 F (36.8 C) (Oral)   Resp 16   Ht 5' 11.5" (1.816 m)   Wt 211 lb 6.4 oz (95.9 kg)   SpO2 100%   BMI 29.07 kg/m   Wt Readings from Last 3 Encounters:  07/14/17 211 lb 6.4 oz (95.9 kg)  02/17/16 215 lb (97.5 kg)  02/07/16 216 lb (98 kg)    Physical Exam  General appearance - alert, well appearing, and in no distress Mental status - alert, oriented to person, place, and time, normal mood, behavior, speech, dress, motor activity, and thought processes Eyes - pupils equal and reactive, extraocular eye movements intact Mouth - mucous membranes moist, pharynx normal without lesions Neck - supple, no significant adenopathy Chest - clear to auscultation, no wheezes, rales or rhonchi, symmetric air entry Heart - normal rate, regular rhythm, normal S1, S2, no murmurs, rubs, clicks or gallops Extremities -trace to 1+ bilateral lower extremity edema.  Large varicose veins left lower extremity over the calf  Results for orders placed or performed in visit on 07/14/17  POCT glucose (manual entry)  Result Value Ref Range   POC Glucose 95 70 - 99 mg/dl   Lab Results  Component Value Date   HGBA1C 6.2 (H) 06/11/2017    ASSESSMENT AND PLAN: 1. Type 2 diabetes mellitus without complication, without long-term current use of insulin (HCC) Well-controlled.  Continue metformin. Discussed healthy eating habits.  Try to stay active.  Even though he does not get in formal exercise he is active on his job. - POCT glucose (manual entry) - Microalbumin / creatinine urine ratio - CBC - Comprehensive metabolic panel - Lipid panel  2. Essential hypertension Not at goal of 130/80.  He has not been taking the HCTZ.  I plan to start low-dose Cardura because of BPH symptoms.  This will help lower blood pressure also  3. Hyperlipidemia, unspecified hyperlipidemia type -Discussed benefits of statin and possible side effects.  Baseline LFTs last year  normal.  I recommend restarting the Pravachol.  We will check LFTs today  4. Benign prostatic hyperplasia with lower urinary tract symptoms, symptom details unspecified -Discontinue Proscar since he does not want to take.  Discontinue Flomax due to allergic reaction.  Will change to doxazosin.  This will also help with blood pressure. - PSA - doxazosin (  CARDURA) 2 MG tablet; Take 0.5 tablets (1 mg total) by mouth daily.  Dispense: 30 tablet; Refill: 6  5. Varicose veins of left lower extremity without ulcer or inflammation Recommend purchasing and wearing compression socks during the day  Patient was given the opportunity to ask questions.  Patient verbalized understanding of the plan and was able to repeat key elements of the plan.   Orders Placed This Encounter  Procedures  . Microalbumin / creatinine urine ratio  . CBC  . Comprehensive metabolic panel  . Lipid panel  . PSA  . POCT glucose (manual entry)     Requested Prescriptions   Signed Prescriptions Disp Refills  . metFORMIN (GLUCOPHAGE) 1000 MG tablet 60 tablet 6    Sig: Take 1 tablet (1,000 mg total) by mouth 2 (two) times daily.  . pravastatin (PRAVACHOL) 20 MG tablet 90 tablet 1    Sig: Take 1 tablet (20 mg total) by mouth daily.  . TRUEPLUS LANCETS 26G MISC 100 each 12    Sig: 1 each by Does not apply route every 8 (eight) hours as needed.  . doxazosin (CARDURA) 2 MG tablet 30 tablet 6    Sig: Take 0.5 tablets (1 mg total) by mouth daily.    Return in about 4 months (around 11/13/2017).  Karle Plumber, MD, FACP

## 2017-07-14 NOTE — Patient Instructions (Signed)
Purchase and wear compression socks to lower legs.

## 2017-07-15 LAB — LIPID PANEL
Chol/HDL Ratio: 4 ratio (ref 0.0–5.0)
Cholesterol, Total: 165 mg/dL (ref 100–199)
HDL: 41 mg/dL (ref >39)
LDL CALC: 113 mg/dL — AB (ref 0–99)
TRIGLYCERIDES: 53 mg/dL (ref 0–149)
VLDL CHOLESTEROL CAL: 11 mg/dL (ref 5–40)

## 2017-07-15 LAB — CBC
Hematocrit: 44.4 % (ref 37.5–51.0)
Hemoglobin: 14.6 g/dL (ref 13.0–17.7)
MCH: 28 pg (ref 26.6–33.0)
MCHC: 32.9 g/dL (ref 31.5–35.7)
MCV: 85 fL (ref 79–97)
PLATELETS: 274 10*3/uL (ref 150–450)
RBC: 5.22 x10E6/uL (ref 4.14–5.80)
RDW: 13.2 % (ref 12.3–15.4)
WBC: 5 10*3/uL (ref 3.4–10.8)

## 2017-07-15 LAB — COMPREHENSIVE METABOLIC PANEL
ALK PHOS: 77 IU/L (ref 39–117)
ALT: 28 IU/L (ref 0–44)
AST: 23 IU/L (ref 0–40)
Albumin/Globulin Ratio: 1.5 (ref 1.2–2.2)
Albumin: 4.3 g/dL (ref 3.5–5.5)
BUN/Creatinine Ratio: 14 (ref 9–20)
BUN: 20 mg/dL (ref 6–24)
Bilirubin Total: 0.3 mg/dL (ref 0.0–1.2)
CO2: 25 mmol/L (ref 20–29)
CREATININE: 1.38 mg/dL — AB (ref 0.76–1.27)
Calcium: 9.9 mg/dL (ref 8.7–10.2)
Chloride: 98 mmol/L (ref 96–106)
GFR calc Af Amer: 68 mL/min/{1.73_m2} (ref >59)
GFR calc non Af Amer: 59 mL/min/{1.73_m2} — ABNORMAL LOW (ref >59)
GLUCOSE: 113 mg/dL — AB (ref 65–99)
Globulin, Total: 2.8 g/dL (ref 1.5–4.5)
Potassium: 4 mmol/L (ref 3.5–5.2)
SODIUM: 140 mmol/L (ref 134–144)
Total Protein: 7.1 g/dL (ref 6.0–8.5)

## 2017-07-15 LAB — MICROALBUMIN / CREATININE URINE RATIO
Creatinine, Urine: 113.9 mg/dL
MICROALB/CREAT RATIO: 2.7 mg/g{creat} (ref 0.0–30.0)
MICROALBUM., U, RANDOM: 3.1 ug/mL

## 2017-07-15 LAB — PSA: Prostate Specific Ag, Serum: 0.9 ng/mL (ref 0.0–4.0)

## 2017-07-19 ENCOUNTER — Telehealth: Payer: Self-pay

## 2017-07-19 NOTE — Telephone Encounter (Signed)
Contacted pt to go over lab results pt didn't answer lvm asking pt to give me a call at his earliest convenience  

## 2017-08-14 MED FILL — metFORMIN HCL 1000 MG TABS: 1000 | 30 days supply | Qty: 60 | Fill #1

## 2017-08-14 MED FILL — DOXAZOSIN MESYLATE 2 MG TAB: 2 | 30 days supply | Qty: 15 | Fill #1

## 2017-09-11 MED FILL — metFORMIN HCL 1000 MG TABS: 1000 | 30 days supply | Qty: 60 | Fill #2

## 2017-09-11 MED FILL — DOXAZOSIN MESYLATE 2 MG TAB: 2 | 30 days supply | Qty: 15 | Fill #2

## 2017-10-12 MED FILL — metFORMIN HCL 1000 MG TABS: 1000 | 30 days supply | Qty: 60 | Fill #3

## 2017-10-12 MED FILL — DOXAZOSIN MESYLATE 2 MG TAB: 2 | 30 days supply | Qty: 15 | Fill #3

## 2017-10-19 ENCOUNTER — Other Ambulatory Visit: Payer: Self-pay

## 2017-11-02 ENCOUNTER — Emergency Department (HOSPITAL_COMMUNITY): Payer: Self-pay

## 2017-11-02 ENCOUNTER — Other Ambulatory Visit: Payer: Self-pay

## 2017-11-02 ENCOUNTER — Emergency Department (HOSPITAL_COMMUNITY)
Admission: EM | Admit: 2017-11-02 | Discharge: 2017-11-02 | Disposition: A | Discharge status: Home / Self Care | Payer: Self-pay | Attending: Emergency Medicine | Admitting: Emergency Medicine

## 2017-11-02 ENCOUNTER — Encounter (HOSPITAL_COMMUNITY): Payer: Self-pay | Admitting: Emergency Medicine

## 2017-11-02 DIAGNOSIS — I10 Essential (primary) hypertension: Secondary | ICD-10-CM | POA: Insufficient documentation

## 2017-11-02 DIAGNOSIS — N401 Enlarged prostate with lower urinary tract symptoms: Secondary | ICD-10-CM | POA: Insufficient documentation

## 2017-11-02 DIAGNOSIS — N400 Enlarged prostate without lower urinary tract symptoms: Secondary | ICD-10-CM

## 2017-11-02 DIAGNOSIS — Z87891 Personal history of nicotine dependence: Secondary | ICD-10-CM | POA: Insufficient documentation

## 2017-11-02 DIAGNOSIS — E119 Type 2 diabetes mellitus without complications: Secondary | ICD-10-CM | POA: Insufficient documentation

## 2017-11-02 DIAGNOSIS — Z7984 Long term (current) use of oral hypoglycemic drugs: Secondary | ICD-10-CM | POA: Insufficient documentation

## 2017-11-02 DIAGNOSIS — R51 Headache: Secondary | ICD-10-CM | POA: Insufficient documentation

## 2017-11-02 DIAGNOSIS — R519 Headache, unspecified: Secondary | ICD-10-CM

## 2017-11-02 MED ORDER — KETOROLAC TROMETHAMINE 30 MG/ML IJ SOLN
30.0000 mg | Freq: Once | INTRAMUSCULAR | Status: AC
  Administered 2017-11-02: 30 mg via INTRAMUSCULAR
  Filled 2017-11-02: qty 1

## 2017-11-02 MED ORDER — HYDROCHLOROTHIAZIDE 25 MG PO TABS: 25.0000 mg | ORAL_TABLET | Freq: Every day | ORAL | 0 refills | Status: DC

## 2017-11-02 MED ORDER — TAMSULOSIN HCL 0.4 MG PO CAPS: 0.4000 mg | ORAL_CAPSULE | Freq: Every day | ORAL | 0 refills | Status: DC

## 2017-11-02 NOTE — Discharge Instructions (Signed)
Please read attached information. If you experience any new or worsening signs or symptoms please return to the emergency room for evaluation. Please follow-up with your primary care provider or specialist as discussed. Please use medication prescribed only as directed and discontinue taking if you have any concerning signs or symptoms.   °

## 2017-11-02 NOTE — ED Triage Notes (Signed)
Pt to ED with c/o headache x's 2 weeks.  Pt st's he has not taken his blood pressure meds in over 3 weeks,.  Pt st's he needs refills on his meds

## 2017-11-02 NOTE — ED Provider Notes (Signed)
[facility] EMERGENCY DEPARTMENT Provider Note   CSN: [id number] Arrival date & time: 11/02/17  1612     History   Chief Complaint Chief Complaint  Patient presents with  . Headache    HPI Stanley Chapman is a 52 y.o. male.  HPI   52 year old male presents today with complaints of hypertension and headache.  Patient notes 2 weeks ago he ate a whole package of cooked bacon.  He notes after that he felt like his blood pressure was elevated, he checked it and it was in the 953 systolic range over 90 diastolic.  He notes generalized throbbing headache that has persisted since that time.  He denies any associated neurological deficits, neck stiffness, trauma, or any other acute complaints or red flags.  Patient notes that he has not had his blood pressure medication or tamsulosin for BPH in the last several weeks.  He denies any chest pain or shortness of breath.  Past Medical History:  Diagnosis Date  . Constipation   . Diabetes mellitus without complication ([place])   . Hernia, umbilical   . Hypertension   . Melanosis coli   . Renal lesion     Patient Active Problem List   Diagnosis Date Noted  . Benign prostatic hyperplasia with lower urinary tract symptoms 07/14/2017  . 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 DM (diabetes mellitus) ([place]) 05/14/2013  . HTN (hypertension) 05/14/2013  . Hyperlipidemia 05/14/2013  . Special screening for malignant neoplasms, colon 05/08/2013    History reviewed. No pertinent surgical history.      Home Medications    Prior to Admission medications   Medication Sig Start Date End Date Taking? Authorizing Provider  Blood Glucose Monitoring Suppl (TRUE METRIX GO GLUCOSE METER) w/Device KIT 1 each by Does not apply route every 8 (eight) hours as needed. 02/17/16   Maren Reamer, MD  doxazosin (CARDURA) 2 MG tablet Take 0.5 tablets (1 mg total) by mouth daily. 07/14/17   Ladell Pier, MD  esomeprazole (NEXIUM) 20 MG capsule Take 20 mg by mouth daily at 12 noon.    [provider]  glucose blood test strip Use as instructed 06/11/17   Wieters, Hallie C, PA-C  hydrochlorothiazide (HYDRODIURIL) 25 MG tablet Take 1 tablet (25 mg total) by mouth daily. 11/02/17   Zhana Jeangilles, Dellis Filbert, PA-C  metFORMIN (GLUCOPHAGE) 1000 MG tablet Take 1 tablet (1,000 mg total) by mouth 2 (two) times daily. 07/14/17   Ladell Pier, MD  pravastatin (PRAVACHOL) 20 MG tablet Take 1 tablet (20 mg total) by mouth daily. 07/14/17   Ladell Pier, MD  tamsulosin (FLOMAX) 0.4 MG CAPS capsule Take 1 capsule (0.4 mg total) by mouth daily. 11/02/17   Hiba Garry, Dellis Filbert, PA-C  TRUEPLUS LANCETS 26G MISC 1 each by Does not apply route every 8 (eight) hours as needed. 07/14/17   Ladell Pier, MD    Family History Family History  Problem Relation Age of Onset  . Hypertension Mother   . Diabetes Mother   . Cancer Mother        Not sure of type    Social History Social History   Tobacco Use  . Smoking status: Former Smoker    Types: Cigarettes  . Smokeless tobacco: Never Used  Substance Use Topics  . Alcohol use: No  . Drug use: No    Comment: former marijuana use - x 20 years ago  Allergies   Patient has no active allergies.   Review of Systems Review of Systems  All other systems reviewed and are negative.    Physical Exam Updated Vital Signs BP (!) 168/93 (BP Location: Right Arm)   Pulse 65   Temp 98.8 F (37.1 C) (Oral)   Resp 16   Ht '5\' 11"'  (1.803 m)   Wt 98.4 kg   SpO2 100%   BMI 30.27 kg/m   Physical Exam  Constitutional: He is oriented to person, place, and time. He appears well-developed and well-nourished.  HENT:  Head: Normocephalic and atraumatic.  Eyes: Pupils are equal, round, and reactive to light. Conjunctivae are normal. Right eye exhibits no discharge. Left eye exhibits no discharge. No scleral icterus.  Neck: Normal range of motion. No  JVD present. No tracheal deviation present.  Cardiovascular: Normal rate, regular rhythm and normal heart sounds.  Pulmonary/Chest: Effort normal. No stridor.  Neurological: He is alert and oriented to person, place, and time. He has normal strength. No cranial nerve deficit or sensory deficit. Coordination normal. GCS eye subscore is 4. GCS verbal subscore is 5. GCS motor subscore is 6.  Psychiatric: He has a normal mood and affect. His behavior is normal. Judgment and thought content normal.  Nursing note and vitals reviewed.    ED Treatments / Results  Labs (all labs ordered are listed, but only abnormal results are displayed) Labs Reviewed - No data to display  EKG None  Radiology Ct Head Wo Contrast  Result Date: 11/02/2017 CLINICAL DATA:  Headache for 2 weeks. EXAM: CT HEAD WITHOUT CONTRAST TECHNIQUE: Contiguous axial images were obtained from the base of the skull through the vertex without intravenous contrast. COMPARISON:  None. FINDINGS: Brain: There is no mass, hemorrhage or extra-axial collection. The size and configuration of the ventricles and extra-axial CSF spaces are normal. There is no acute or chronic infarction. The brain parenchyma is normal. Vascular: No abnormal hyperdensity of the major intracranial arteries or dural venous sinuses. No intracranial atherosclerosis. Skull: The visualized skull base, calvarium and extracranial soft tissues are normal. Sinuses/Orbits: No fluid levels or advanced mucosal thickening of the visualized paranasal sinuses. No mastoid or middle ear effusion. The orbits are normal. IMPRESSION: Normal head CT. Electronically Signed   By: Ulyses Jarred M.D.   On: 11/02/2017 20:43    Procedures Procedures (including critical care time)  Medications Ordered in ED Medications  ketorolac (TORADOL) 30 MG/ML injection 30 mg (30 mg Intramuscular Given 11/02/17 2038)     Initial Impression / Assessment and Plan / ED Course  I have reviewed the  triage vital signs and the nursing notes.  Pertinent labs & imaging results that were available during my care of the patient were reviewed by me and considered in my medical decision making (see chart for details).     Labs:   Imaging: CT head wo  Consults:  Therapeutics: Toradol  Discharge Meds: HCTZ, tamsulosin  Assessment/Plan: 52 year old male presents today with complaints of hypertension and headache.  Patient does have elevated blood pressure here, he has generalized headache that is nonsevere.  He has no red flags, have lower suspicion for endorgan damage from hypertension.  Patient's head CT reassuring, no neurological deficits, symptoms improved with Toradol.  Patient be discharged with HCTZ which she has been on previously.  Patient also requesting tamsulosin refill, I do find this reasonable I have encouraged him to follow-up as an outpatient with primary care and urology, he is given strict return  precautions, he verbalized understanding and agreement to today's plan had no further questions or concerns at the time discharge.      Final Clinical Impressions(s) / ED Diagnoses   Final diagnoses:  Acute nonintractable headache, unspecified headache type  Hypertension, unspecified type  Benign prostatic hyperplasia, unspecified whether lower urinary tract symptoms present    ED Discharge Orders         Ordered    tamsulosin (FLOMAX) 0.4 MG CAPS capsule  Daily     11/02/17 2136    hydrochlorothiazide (HYDRODIURIL) 25 MG tablet  Daily     11/02/17 2136           Okey Regal, PA-C 11/02/17 2151    Gareth Morgan, MD 11/04/17 1341

## 2017-11-02 NOTE — ED Notes (Signed)
Patient transported to CT 

## 2017-11-21 MED FILL — DOXAZOSIN MESYLATE 2 MG TAB: 2 | 30 days supply | Qty: 15 | Fill #4

## 2017-11-21 MED FILL — metFORMIN HCL 1000 MG TABS: 1000 | 30 days supply | Qty: 60 | Fill #4

## 2018-01-01 MED FILL — metFORMIN HCL 1000 MG TABS: 1000 | 30 days supply | Qty: 60 | Fill #5

## 2018-01-01 MED FILL — DOXAZOSIN MESYLATE 2 MG TAB: 2 | 30 days supply | Qty: 15 | Fill #5

## 2018-02-08 MED FILL — metFORMIN HCL 1000 MG TABS: 1000 | 30 days supply | Qty: 60 | Fill #6

## 2018-02-08 MED FILL — DOXAZOSIN MESYLATE 2 MG TAB: 2 | 30 days supply | Qty: 15 | Fill #6

## 2018-02-16 ENCOUNTER — Encounter (HOSPITAL_COMMUNITY): Payer: Self-pay

## 2018-02-16 ENCOUNTER — Emergency Department (HOSPITAL_COMMUNITY)
Admission: EM | Admit: 2018-02-16 | Discharge: 2018-02-16 | Disposition: A | Discharge status: Home / Self Care | Payer: Self-pay | Attending: Emergency Medicine | Admitting: Emergency Medicine

## 2018-02-16 DIAGNOSIS — K4090 Unilateral inguinal hernia, without obstruction or gangrene, not specified as recurrent: Secondary | ICD-10-CM | POA: Insufficient documentation

## 2018-02-16 DIAGNOSIS — Z79899 Other long term (current) drug therapy: Secondary | ICD-10-CM | POA: Insufficient documentation

## 2018-02-16 DIAGNOSIS — Z7984 Long term (current) use of oral hypoglycemic drugs: Secondary | ICD-10-CM | POA: Insufficient documentation

## 2018-02-16 DIAGNOSIS — I10 Essential (primary) hypertension: Secondary | ICD-10-CM | POA: Insufficient documentation

## 2018-02-16 DIAGNOSIS — Z87891 Personal history of nicotine dependence: Secondary | ICD-10-CM | POA: Insufficient documentation

## 2018-02-16 DIAGNOSIS — E119 Type 2 diabetes mellitus without complications: Secondary | ICD-10-CM | POA: Insufficient documentation

## 2018-02-16 DIAGNOSIS — Z7902 Long term (current) use of antithrombotics/antiplatelets: Secondary | ICD-10-CM | POA: Insufficient documentation

## 2018-02-16 LAB — CBC WITH DIFFERENTIAL/PLATELET
Abs Immature Granulocytes: 0.01 10*3/uL (ref 0.00–0.07)
BASOS ABS: 0 10*3/uL (ref 0.0–0.1)
Basophils Relative: 1 %
Eosinophils Absolute: 0.3 10*3/uL (ref 0.0–0.5)
Eosinophils Relative: 5 %
HEMATOCRIT: 44.2 % (ref 39.0–52.0)
HEMOGLOBIN: 14 g/dL (ref 13.0–17.0)
IMMATURE GRANULOCYTES: 0 %
LYMPHS ABS: 1.3 10*3/uL (ref 0.7–4.0)
LYMPHS PCT: 22 %
MCH: 27.7 pg (ref 26.0–34.0)
MCHC: 31.7 g/dL (ref 30.0–36.0)
MCV: 87.5 fL (ref 80.0–100.0)
Monocytes Absolute: 0.7 10*3/uL (ref 0.1–1.0)
Monocytes Relative: 11 %
NEUTROS ABS: 3.8 10*3/uL (ref 1.7–7.7)
NEUTROS PCT: 61 %
Platelets: 282 10*3/uL (ref 150–400)
RBC: 5.05 MIL/uL (ref 4.22–5.81)
RDW: 13.2 % (ref 11.5–15.5)
WBC: 6.2 10*3/uL (ref 4.0–10.5)
nRBC: 0 % (ref 0.0–0.2)

## 2018-02-16 LAB — COMPREHENSIVE METABOLIC PANEL
ALK PHOS: 55 U/L (ref 38–126)
ALT: 30 U/L (ref 0–44)
AST: 25 U/L (ref 15–41)
Albumin: 3.7 g/dL (ref 3.5–5.0)
Anion gap: 10 (ref 5–15)
BUN: 25 mg/dL — AB (ref 6–20)
CO2: 24 mmol/L (ref 22–32)
Calcium: 9 mg/dL (ref 8.9–10.3)
Chloride: 102 mmol/L (ref 98–111)
Creatinine, Ser: 1.45 mg/dL — ABNORMAL HIGH (ref 0.61–1.24)
GFR calc Af Amer: >60 mL/min (ref >60)
GFR calc non Af Amer: 55 mL/min — ABNORMAL LOW (ref >60)
GLUCOSE: 121 mg/dL — AB (ref 70–99)
POTASSIUM: 4.1 mmol/L (ref 3.5–5.1)
SODIUM: 136 mmol/L (ref 135–145)
TOTAL PROTEIN: 6.8 g/dL (ref 6.5–8.1)
Total Bilirubin: 0.6 mg/dL (ref 0.3–1.2)

## 2018-02-16 LAB — LIPASE, BLOOD: Lipase: 38 U/L (ref 11–51)

## 2018-02-16 LAB — URINALYSIS, ROUTINE W REFLEX MICROSCOPIC
Bilirubin Urine: NEGATIVE
GLUCOSE, UA: NEGATIVE mg/dL
Hgb urine dipstick: NEGATIVE
Ketones, ur: NEGATIVE mg/dL
LEUKOCYTES UA: NEGATIVE
Nitrite: NEGATIVE
PH: 5 (ref 5.0–8.0)
Protein, ur: NEGATIVE mg/dL
SPECIFIC GRAVITY, URINE: 1.025 (ref 1.005–1.030)

## 2018-02-16 NOTE — Discharge Instructions (Addendum)
Get help right away if: You have pain in your groin that suddenly gets worse. You have a bulge in your groin that: Suddenly gets bigger and does not get smaller. Becomes red or purple or painful to the touch. You are a man and you have a sudden pain in your scrotum, or the size of your scrotum suddenly changes. You cannot push the hernia back in place by very gently pressing on it when you are lying down. Do not try to force the bulge back in if it will not push in easily. You have nausea or vomiting that does not go away. You have a fast heartbeat. You cannot have a bowel movement or pass gas.

## 2018-02-16 NOTE — ED Notes (Signed)
Patient verbalizes understanding of medications and discharge instructions. No further questions at this time. VSS and patient ambulatory at discharge.   

## 2018-02-16 NOTE — ED Provider Notes (Signed)
[facility] EMERGENCY DEPARTMENT Provider Note   CSN: [id number] Arrival date & time: 02/16/18  0316     History   Chief Complaint Chief Complaint  Patient presents with  . Abdominal Pain    HPI Stanley Chapman is a 53 y.o. male who presents chief complaint of left groin swelling.  Patient states that he has noticed an area of swelling in his left groin that seems to come and go.  Today he was working, doing some vacuuming and he noticed sudden sharp pain in his left lower groin that got progressively worse.  He also noticed more swelling in that region than normal.  Patient states that his pain lasted for about 2 hours all the way until he got into the bed at the ER at which point his pain began to subside and the swelling in that region began to decrease.  He denies any active pain at this time.  He is never had sharp pain like that in the past.  He denies urinary or penile symptoms.  HPI  Past Medical History:  Diagnosis Date  . Constipation   . Diabetes mellitus without complication ([place])   . Hernia, umbilical   . Hypertension   . Melanosis coli   . Renal lesion     Patient Active Problem List   Diagnosis Date Noted  . Benign prostatic hyperplasia with lower urinary tract symptoms 07/14/2017  . 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 DM (diabetes mellitus) ([place]) 05/14/2013  . HTN (hypertension) 05/14/2013  . Hyperlipidemia 05/14/2013  . Special screening for malignant neoplasms, colon 05/08/2013    History reviewed. No pertinent surgical history.      Home Medications    Prior to Admission medications   Medication Sig Start Date End Date Taking? Authorizing Provider  Blood Glucose Monitoring Suppl (TRUE METRIX GO GLUCOSE METER) w/Device KIT 1 each by Does not apply route every 8 (eight) hours as needed. 02/17/16   Maren Reamer, MD  doxazosin (CARDURA) 2 MG tablet Take 0.5 tablets (1 mg total) by  mouth daily. 07/14/17   Ladell Pier, MD  esomeprazole (NEXIUM) 20 MG capsule Take 20 mg by mouth daily at 12 noon.    [provider]  glucose blood test strip Use as instructed 06/11/17   Wieters, Hallie C, PA-C  hydrochlorothiazide (HYDRODIURIL) 25 MG tablet Take 1 tablet (25 mg total) by mouth daily. 11/02/17   Hedges, Dellis Filbert, PA-C  metFORMIN (GLUCOPHAGE) 1000 MG tablet Take 1 tablet (1,000 mg total) by mouth 2 (two) times daily. 07/14/17   Ladell Pier, MD  pravastatin (PRAVACHOL) 20 MG tablet Take 1 tablet (20 mg total) by mouth daily. 07/14/17   Ladell Pier, MD  tamsulosin (FLOMAX) 0.4 MG CAPS capsule Take 1 capsule (0.4 mg total) by mouth daily. 11/02/17   Hedges, Dellis Filbert, PA-C  TRUEPLUS LANCETS 26G MISC 1 each by Does not apply route every 8 (eight) hours as needed. 07/14/17   Ladell Pier, MD    Family History Family History  Problem Relation Age of Onset  . Hypertension Mother   . Diabetes Mother   . Cancer Mother        Not sure of type    Social History Social History   Tobacco Use  . Smoking status: Former Smoker    Types: Cigarettes  . Smokeless tobacco: Never Used  Substance Use Topics  . Alcohol use: No  . Drug  use: No    Comment: former marijuana use - x 20 years ago     Allergies   Patient has no known allergies.   Review of Systems Review of Systems  Ten systems reviewed and are negative for acute change, except as noted in the HPI.   Physical Exam Updated Vital Signs BP (!) 143/98 (BP Location: Right Arm)   Pulse 69   Temp 98.9 F (37.2 C) (Oral)   Resp 19   Ht '5\' 11"'  (1.803 m)   Wt 98.4 kg   SpO2 100%   BMI 30.27 kg/m   Physical Exam Vitals signs and nursing note reviewed.  Constitutional:      General: He is not in acute distress.    Appearance: He is well-developed. He is not diaphoretic.  HENT:     Head: Normocephalic and atraumatic.  Eyes:     General: No scleral icterus.    Conjunctiva/sclera:  Conjunctivae normal.  Neck:     Musculoskeletal: Normal range of motion and neck supple.  Cardiovascular:     Rate and Rhythm: Normal rate and regular rhythm.     Heart sounds: Normal heart sounds.  Pulmonary:     Effort: Pulmonary effort is normal. No respiratory distress.     Breath sounds: Normal breath sounds.  Abdominal:     Palpations: Abdomen is soft.     Tenderness: There is no abdominal tenderness.     Hernia: A hernia is present. Hernia is present in the left inguinal area.     Comments: Patient with palpable hernia in the left inguinal region Bowel sounds are present with auscultation of the left groin.  No firmness or tenderness.  Skin:    General: Skin is warm and dry.  Neurological:     Mental Status: He is alert.  Psychiatric:        Behavior: Behavior normal.      ED Treatments / Results  Labs (all labs ordered are listed, but only abnormal results are displayed) Labs Reviewed  CBC WITH DIFFERENTIAL/PLATELET  URINALYSIS, ROUTINE W REFLEX MICROSCOPIC  COMPREHENSIVE METABOLIC PANEL  LIPASE, BLOOD    EKG None  Radiology No results found.  Procedures Procedures (including critical care time)  Medications Ordered in ED Medications - No data to display   Initial Impression / Assessment and Plan / ED Course  I have reviewed the triage vital signs and the nursing notes.  Pertinent labs & imaging results that were available during my care of the patient were reviewed by me and considered in my medical decision making (see chart for details).     Patient with suspected inguinal hernia.  I did feel as if I was able to reduce this earlier.  There is no evidence of incarceration.  Patient states that it is much smaller than it was when he came in and he has having no pain at this time.  Review of labs shows no significant abnormalities.  Patient is advised to follow-up with his primary care physician and [facility] [place] [facility] for evaluation of the hernia.   Discussed return precautions and supportive outpatient care.  He appears appropriate for discharge at this time  Final Clinical Impressions(s) / ED Diagnoses   Final diagnoses:  None    ED Discharge Orders    None       Margarita Mail, PA-C 02/16/18 5701    Duffy Bruce, MD 02/18/18 1348

## 2018-02-16 NOTE — ED Triage Notes (Signed)
Patient c/o increased lower abdominal pain tonight. Patient reports similar episodes in past months after taking Miralax. Patient states "after taking it I will get this gas bubble on the LEFT side but after I pass gas it goes away. But tonight it won't go away and it hurts". Denies chest pain, SOB, fever, dysuria.

## 2018-02-22 ENCOUNTER — Encounter (HOSPITAL_COMMUNITY): Payer: Self-pay | Admitting: Emergency Medicine

## 2018-02-22 ENCOUNTER — Other Ambulatory Visit: Payer: Self-pay

## 2018-02-22 ENCOUNTER — Emergency Department (HOSPITAL_COMMUNITY)
Admission: EM | Admit: 2018-02-22 | Discharge: 2018-02-22 | Disposition: A | Discharge status: Home / Self Care | Payer: Self-pay | Attending: Emergency Medicine | Admitting: Emergency Medicine

## 2018-02-22 DIAGNOSIS — I10 Essential (primary) hypertension: Secondary | ICD-10-CM | POA: Insufficient documentation

## 2018-02-22 DIAGNOSIS — Z7984 Long term (current) use of oral hypoglycemic drugs: Secondary | ICD-10-CM | POA: Insufficient documentation

## 2018-02-22 DIAGNOSIS — K4090 Unilateral inguinal hernia, without obstruction or gangrene, not specified as recurrent: Secondary | ICD-10-CM | POA: Insufficient documentation

## 2018-02-22 DIAGNOSIS — K429 Umbilical hernia without obstruction or gangrene: Secondary | ICD-10-CM

## 2018-02-22 DIAGNOSIS — E119 Type 2 diabetes mellitus without complications: Secondary | ICD-10-CM | POA: Insufficient documentation

## 2018-02-22 DIAGNOSIS — Z79899 Other long term (current) drug therapy: Secondary | ICD-10-CM | POA: Insufficient documentation

## 2018-02-22 DIAGNOSIS — Z87891 Personal history of nicotine dependence: Secondary | ICD-10-CM | POA: Insufficient documentation

## 2018-02-22 NOTE — Discharge Instructions (Signed)
You have been diagnosed today with Left Inguinal Hernia.   At this time there does not appear to be the presence of an emergent medical condition, however there is always the potential for conditions to change. Please read and follow the below instructions.  Please return to the Emergency Department immediately for any new or worsening symptoms or if your symptoms return and do not go away quickly. Please be sure to follow up with your Primary Care Provider this week regarding your visit today; please call their office to schedule an appointment even if you are feeling better for a follow-up visit. Please call [facility] today to schedule an appointment for further evaluation and possible treatment of your hernia.  Get help right away if: You have pain in your groin that suddenly gets worse. You have a bulge in your groin that: Suddenly gets bigger and does not get smaller. Becomes red or purple or painful to the touch. You are a man and you have a sudden pain in your scrotum, or the size of your scrotum suddenly changes. You cannot push the hernia back in place by very gently pressing on it when you are lying down. Do not try to force the bulge back in if it will not push in easily. You have nausea or vomiting that does not go away. You have a fast heartbeat. You cannot have a bowel movement or pass gas. You have fever Get help right away if: You develop sudden, severe pain near the area of your hernia. You have pain as well as nausea or vomiting. You have pain and the skin over your hernia changes color. You develop a fever.  Please read the additional information packets attached to your discharge summary.  Do not take your medicine if  develop an itchy rash, swelling in your mouth or lips, or difficulty breathing.

## 2018-02-22 NOTE — ED Provider Notes (Signed)
[facility] Provider Note   CSN: [id number] Arrival date & time: 02/22/18  0151     History   Chief Complaint Chief Complaint  Patient presents with  . Hernia    HPI Stanley Chapman is a 53 y.o. male presents today with left inguinal swelling that began last night while vacuuming.  Patient states that while vacuuming he had a sudden moderate intensity sharp pain to his left inguinal area with associated moderate swelling.  Patient states that this pain was initially worse with movement.  Patient reports he then reported to the emergency department and waited several hours in the emergency room reclined in a chair.  Patient states that during this time his swelling and pain completely resolved.  On my evaluation patient states that he is feeling well and is without complaints.  Patient denies history of fever, nausea/vomiting, change in bowel movements, blood in the stool, dysuria/hematuria, testicular pain/swelling or urinary retention.  Patient was seen here on 02/16/2018 similar symptoms.  At that time he was encouraged to call the general surgeon to schedule an appointment however he states that he has not done this because he was worried that the appointment would take a long time to be scheduled.  HPI  Past Medical History:  Diagnosis Date  . Constipation   . Diabetes mellitus without complication ([place])   . Hernia, umbilical   . Hypertension   . Melanosis coli   . Renal lesion     Patient Active Problem List   Diagnosis Date Noted  . Benign prostatic hyperplasia with lower urinary tract symptoms 07/14/2017  . 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 DM (diabetes mellitus) ([place]) 05/14/2013  . HTN (hypertension) 05/14/2013  . Hyperlipidemia 05/14/2013  . Special screening for malignant neoplasms, colon 05/08/2013    History reviewed. No pertinent surgical history.      Home  Medications    Prior to Admission medications   Medication Sig Start Date End Date Taking? Authorizing Provider  Blood Glucose Monitoring Suppl (TRUE METRIX GO GLUCOSE METER) w/Device KIT 1 each by Does not apply route every 8 (eight) hours as needed. 02/17/16   Maren Reamer, MD  doxazosin (CARDURA) 2 MG tablet Take 0.5 tablets (1 mg total) by mouth daily. 07/14/17   Ladell Pier, MD  esomeprazole (NEXIUM) 20 MG capsule Take 20 mg by mouth daily at 12 noon.    [provider]  glucose blood test strip Use as instructed 06/11/17   Wieters, Hallie C, PA-C  hydrochlorothiazide (HYDRODIURIL) 25 MG tablet Take 1 tablet (25 mg total) by mouth daily. 11/02/17   Hedges, Dellis Filbert, PA-C  metFORMIN (GLUCOPHAGE) 1000 MG tablet Take 1 tablet (1,000 mg total) by mouth 2 (two) times daily. 07/14/17   Ladell Pier, MD  pravastatin (PRAVACHOL) 20 MG tablet Take 1 tablet (20 mg total) by mouth daily. 07/14/17   Ladell Pier, MD  tamsulosin (FLOMAX) 0.4 MG CAPS capsule Take 1 capsule (0.4 mg total) by mouth daily. 11/02/17   Hedges, Dellis Filbert, PA-C  TRUEPLUS LANCETS 26G MISC 1 each by Does not apply route every 8 (eight) hours as needed. 07/14/17   Ladell Pier, MD    Family History Family History  Problem Relation Age of Onset  . Hypertension Mother   . Diabetes Mother   . Cancer Mother        Not sure of type    Social History Social  History   Tobacco Use  . Smoking status: Former Smoker    Types: Cigarettes  . Smokeless tobacco: Never Used  Substance Use Topics  . Alcohol use: No  . Drug use: No    Comment: former marijuana use - x 20 years ago     Allergies   Patient has no known allergies.   Review of Systems Review of Systems  Constitutional: Negative.  Negative for chills and fever.  Respiratory: Negative.  Negative for cough and shortness of breath.   Cardiovascular: Negative.  Negative for chest pain.  Gastrointestinal: Negative.  Negative for abdominal  pain, blood in stool, diarrhea, nausea and vomiting.  Genitourinary: Negative for difficulty urinating, discharge, dysuria, hematuria, penile pain, scrotal swelling and testicular pain.       Left inguinal hernia  Musculoskeletal: Negative.  Negative for arthralgias and myalgias.  Neurological: Negative.  Negative for weakness.  All other systems reviewed and are negative.  Physical Exam Updated Vital Signs BP 140/89   Pulse 83   Temp 98 F (36.7 C) (Oral)   Resp 18   SpO2 100%   Physical Exam Constitutional:      General: He is not in acute distress.    Appearance: Normal appearance. He is well-developed. He is obese. He is not ill-appearing or diaphoretic.  HENT:     Head: Normocephalic and atraumatic.     Right Ear: External ear normal.     Left Ear: External ear normal.     Nose: Nose normal.  Eyes:     Pupils: Pupils are equal, round, and reactive to light.  Neck:     Musculoskeletal: Normal range of motion.     Trachea: Trachea normal. No tracheal deviation.  Cardiovascular:     Rate and Rhythm: Normal rate and regular rhythm.     Pulses: Normal pulses.     Heart sounds: Normal heart sounds.  Pulmonary:     Effort: Pulmonary effort is normal. No respiratory distress.     Breath sounds: Normal breath sounds and air entry.  Chest:     Chest wall: No tenderness.  Abdominal:     General: Bowel sounds are normal.     Palpations: Abdomen is soft.     Tenderness: There is no abdominal tenderness. There is no guarding or rebound.     Hernia: A hernia is present. Hernia is present in the umbilical area. There is no hernia in the right inguinal area, left inguinal area, right femoral area or left femoral area.     Comments: 2 cm easily reducible umbilical hernia present.  Genitourinary:    Comments: Chaperone present during genital exam Arboriculturist.  No external genital lesions noted, no bumps on head of penis, specifically no vesicles concerning for herpes or chancre  suggestive of syphilis.  No pain with palpation of the penis/glans, no discharge or urethritis noted.  Scrotum and testicles without erythema/swelling or tenderness to palpation. Cremasteric reflex intact bilaterally. No palpable inguinal hernia noted.  Attempted Valsalva and standing without recurrence of patient's swelling. Musculoskeletal: Normal range of motion.  Skin:    General: Skin is warm and dry.     Capillary Refill: Capillary refill takes less than 2 seconds.  Neurological:     General: No focal deficit present.     Mental Status: He is alert.     GCS: GCS eye subscore is 4. GCS verbal subscore is 5. GCS motor subscore is 6.     Comments: Speech is clear  and goal oriented, follows commands Major Cranial nerves without deficit, no facial droop Moves extremities without ataxia, coordination intact Normal gait  Psychiatric:        Behavior: Behavior normal.    ED Treatments / Results  Labs (all labs ordered are listed, but only abnormal results are displayed) Labs Reviewed - No data to display  EKG None  Radiology No results found.  Procedures Procedures (including critical care time)  Medications Ordered in ED Medications - No data to display   Initial Impression / Assessment and Plan / ED Course  I have reviewed the triage vital signs and the nursing notes.  Pertinent labs & imaging results that were available during my care of the patient were reviewed by me and considered in my medical decision making (see chart for details).    53 year old male presenting with complaint of left inguinal swelling.  Patient's swelling and pain self resolved while waiting in the emergency department today.  We were unable to reproduce patient's swelling/pain with Valsalva or standing today.  Based on patient history, physical examination and previous visit I do suspect patient with resolved inguinal hernia today.  I have strongly encouraged that patient call general surgery  today to schedule an appointment for further evaluation and possible treatment to avoid recurrence of inguinal hernia in the future.  Additionally patient with 2 cm in diameter umbilical hernia, this was easily reduced on examination today, no signs of incarceration/entrapment patient without pain around the umbilicus. No signs of gangrene or obstruction today. Physical examination is overall reassuring and patient is pain-free at time of my evaluation.  At this time there does not appear to be any evidence of an acute emergency medical condition and the patient appears stable for discharge with appropriate outpatient follow up. Diagnosis was discussed with patient who verbalizes understanding of care plan and is agreeable to discharge. I have discussed return precautions with patient who verbalize understanding of return precautions. Patient strongly encouraged to follow-up with their PCP this week and to call general surgery today. All questions answered.  Patient's case discussed with Dr. Leonette Monarch who agrees with plan to discharge with follow-up.   Note: Portions of this report may have been transcribed using voice recognition software. Every effort was made to ensure accuracy; however, inadvertent computerized transcription errors may still be present. Final Clinical Impressions(s) / ED Diagnoses   Final diagnoses:  Left inguinal hernia  Reducible umbilical hernia    ED Discharge Orders    None       Gari Crown 02/22/18 1464    Fatima Blank, MD 02/22/18 2306

## 2018-02-22 NOTE — ED Notes (Signed)
Patient verbalizes understanding of medications and discharge instructions. No further questions at this time. VSS and patient ambulatory at discharge.   

## 2018-02-22 NOTE — ED Triage Notes (Signed)
Pt reports L inguinal hernia.  States he was vacuuming and noticed sudden sharp pain in his left groin.  Seen here for same on 1/10.

## 2018-03-07 ENCOUNTER — Ambulatory Visit: Payer: Self-pay | Admitting: General Surgery

## 2018-03-07 NOTE — H&P (Signed)
B[facility] Laurel Dimmer Documented: 03/02/2018 2:22 PM Location: [facility] Patient #: [id number] DOB: 05/15/1965 Single / Language: Cleophus Molt / Race: Black or African American Male   History of Present Illness Randall Hiss M. Jazlen Ogarro MD; 03/02/2018 3:10 PM) The patient is a 53 year old male who presents with an inguinal hernia. He comes in for evaluation of a left inguinal hernia as well as a umbilical hernia. He has been twice in the emergency room in the past 6-8 weeks. He has had a long-standing umbilical hernia however it has started to bother him recently. He describes it as a mild soreness and discomfort. It is generally able to be reduced. However most recently has been having issues with swelling in his left groin. He has daily discomfort. It will generally go down at night but become larger and symptomatic throughout the day. He describes a pulling and taking sensation but at times it is burning, shooting pain especially when it is very swollen. He denies any nausea, vomiting, diarrhea or constipation. He had a prior right inguinal hernia repair as a child. He denies any chest pain or chest pressure. He denies any tobacco use. He denies any dysuria   Problem List/Past Medical Randall Hiss M. Redmond Pulling, MD; 7/61/6073 7:10 PM) UMBILICAL HERNIA WITHOUT OBSTRUCTION OR GANGRENE (K42.9)  LEFT INGUINAL HERNIA (K40.90)   Past Surgical History Nance Pew, CMA; 03/02/2018 2:22 PM) Open Inguinal Hernia Surgery  Right.  Diagnostic Studies History Nance Pew, [place]; 03/02/2018 2:22 PM) Colonoscopy  5-10 years ago  Allergies Nance Pew, CMA; 03/02/2018 2:23 PM) No Known Allergies [03/02/2018]: No Known Drug Allergies [03/02/2018]: Allergies Reconciled   Medication History Nance Pew, CMA; 03/02/2018 2:23 PM) metFORMIN HCl (1000MG  Tablet, Oral) Active. hydroCHLOROthiazide (25MG  Tablet, Oral) Active. Medications Reconciled  Social History Nance Pew, CMA; 03/02/2018 2:22  PM) Alcohol use  Remotely quit alcohol use. Caffeine use  Carbonated beverages, Coffee, Tea. No drug use  Tobacco use  Never smoker.  Family History Nance Pew, [place]; 03/02/2018 2:22 PM) Arthritis  Mother. Cancer  Mother. Diabetes Mellitus  Brother, Mother, Sister. Hypertension  Brother, Mother, Sister. Migraine Headache  Mother.  Other Problems Randall Hiss M. Redmond Pulling, MD; 03/02/2018 3:13 PM) Diabetes Mellitus  Gastroesophageal Reflux Disease  High blood pressure  Inguinal Hernia     Review of Systems ([place]; 03/02/2018 2:22 PM) General Not Present- Appetite Loss, Chills, Fatigue, Fever, Night Sweats, Weight Gain and Weight Loss. Skin Not Present- Change in Wart/Mole, Dryness, Hives, Jaundice, New Lesions, Non-Healing Wounds, Rash and Ulcer. HEENT Present- Seasonal Allergies. Not Present- Earache, Hearing Loss, Hoarseness, Nose Bleed, Oral Ulcers, Ringing in the Ears, Sinus Pain, Sore Throat, Visual Disturbances, Wears glasses/contact lenses and Yellow Eyes. Respiratory Not Present- Bloody sputum, Chronic Cough, Difficulty Breathing, Snoring and Wheezing. Breast Not Present- Breast Mass, Breast Pain, Nipple Discharge and Skin Changes. Cardiovascular Not Present- Chest Pain, Difficulty Breathing Lying Down, Leg Cramps, Palpitations, Rapid Heart Rate, Shortness of Breath and Swelling of Extremities. Gastrointestinal Present- Abdominal Pain and Constipation. Not Present- Bloating, Bloody Stool, Change in Bowel Habits, Chronic diarrhea, Difficulty Swallowing, Excessive gas, Gets full quickly at meals, Hemorrhoids, Indigestion, Nausea, Rectal Pain and Vomiting. Male Genitourinary Present- Change in Urinary Stream, Frequency, Nocturia and Urgency. Not Present- Blood in Urine, Impotence, Painful Urination and Urine Leakage. Neurological Present- Headaches. Not Present- Decreased Memory, Fainting, Numbness, Seizures, Tingling, Tremor, Trouble walking and Weakness. Psychiatric  Not Present- Anxiety, Bipolar, Change in Sleep Pattern, Depression, Fearful and Frequent crying. Hematology Not Present- Blood Thinners, Easy Bruising,  Excessive bleeding, Gland problems, HIV and Persistent Infections.  Vitals (Sabrina Canty CMA; 03/02/2018 2:24 PM) 03/02/2018 2:23 PM Weight: 214.13 lb Height: 70in Body Surface Area: 2.15 m Body Mass Index: 30.72 kg/m  Temp.: 96.58F(Temporal)  Pulse: 72 (Regular)  P.OX: 99% (Room air) BP: 168/88 (Sitting, Left Arm, Standard)       Physical Exam Randall Hiss M. Judeth Gilles MD; 03/02/2018 3:11 PM) General Mental Status-Alert. General Appearance-Consistent with stated age. Hydration-Well hydrated. Voice-Normal.  Head and Neck Head-normocephalic, atraumatic with no lesions or palpable masses. Trachea-midline. Thyroid Gland Characteristics - normal size and consistency.  Eye Eyeball - Bilateral-Extraocular movements intact. Sclera/Conjunctiva - Bilateral-No scleral icterus.  Chest and Lung Exam Chest and lung exam reveals -quiet, even and easy respiratory effort with no use of accessory muscles and on auscultation, normal breath sounds, no adventitious sounds and normal vocal resonance. Inspection Chest Wall - Normal. Back - normal.  Breast - Did not examine.  Cardiovascular Cardiovascular examination reveals -normal heart sounds, regular rate and rhythm with no murmurs and normal pedal pulses bilaterally.  Abdomen Inspection  Inspection of the abdomen reveals: Note: Positive umbilical bulge. Soft, nontender. Reducible. Fascial defect about 2 cm. Skin - Scar - no surgical scars. Palpation/Percussion Palpation and Percussion of the abdomen reveal - Soft, Non Tender, No Rebound tenderness, No Rigidity (guarding) and No hepatosplenomegaly. Auscultation Auscultation of the abdomen reveals - Bowel sounds normal.  Male Genitourinary Note: Both testicles descended. Obvious left groin bulge. Easily  reducible.   Peripheral Vascular Upper Extremity Palpation - Pulses bilaterally normal.  Neurologic Neurologic evaluation reveals -alert and oriented x 3 with no impairment of recent or remote memory. Mental Status-Normal.  Neuropsychiatric The patient's mood and affect are described as -normal. Judgment and Insight-insight is appropriate concerning matters relevant to self.  Musculoskeletal Normal Exam - Left-Upper Extremity Strength Normal and Lower Extremity Strength Normal. Normal Exam - Right-Upper Extremity Strength Normal and Lower Extremity Strength Normal.  Lymphatic Head & Neck  General Head & Neck Lymphatics: Bilateral - Description - Normal. Axillary - Did not examine. Femoral & Inguinal - Did not examine.    Assessment & Plan Randall Hiss M. Latrena Benegas MD; 8/58/8502 7:74 PM) UMBILICAL HERNIA WITHOUT OBSTRUCTION OR GANGRENE (K42.9) Impression: The patient is interested in undergoing umbilical hernia repair as well as left inguinal hernia repair. Since he has both an umbilical hernia and a inguinal hernia I recommended laparoscopic approach to repair the inguinal hernia and therefore we can use the pre-existing fascial defect at the umbilicus for access to the abdomen. We can then close the umbilical hernia on the way out. Given the size of the umbilical hernia I have recommended primary repair with mesh underlay which we would do laparoscopically. He was shown an actual piece of mesh.  We discussed the etiology of umbilical hernias. We discussed the signs and symptoms of incarceration and strangulation. The patient was given educational material. I also drew diagrams.  We discussed nonoperative and operative management. With respect to operative management, we discussed open repair  We discussed the risk and benefits of surgery including but not limited to bleeding, infection, injury to surrounding structures, hernia recurrence, mesh complications, hematoma/seroma  formation, blood clot formation, urinary retention, post operative ileus, general anesthesia risk, abdominal pain. We discussed the importance of avoiding heavy lifting and straining for a period of 6 weeks.  The patient has elected to proceed with LAP ASSIST REPAIR OF UMBILICAL HERNIA with MESH. Current Plans Pt Education - Pamphlet Given - Hernia Surgery: discussed with  patient and provided information. LEFT INGUINAL HERNIA (K40.90) Impression: We discussed the etiology of inguinal hernias. We discussed the signs & symptoms of incarceration & strangulation. We discussed non-operative and operative management. We discussed both open and laparoscopic approaches. However since he has an umbilical hernia I recommended a laparoscopic inguinal hernia repair.  The patient has elected to proceed with laparoscopic repair of left inguinal hernia mesh with laparoscopic-assisted repair of umbilical hernia with mesh   I described the procedure in detail. The patient was given educational material. We discussed the risks and benefits including but not limited to bleeding, infection, chronic inguinal pain, nerve entrapment, hernia recurrence, mesh complications, hematoma formation, urinary retention, injury to the testicles or the ovaries, numbness in the groin, blood clots, injury to the surrounding structures, and anesthesia risk. We also discussed the typical post operative recovery course, including no heavy lifting for 6 weeks. I explained that the likelihood of improvement of their symptoms is good Current Plans Pt Education - Pamphlet Given - Laparoscopic Hernia Repair: discussed with patient and provided information.  Leighton Ruff. Redmond Pulling, MD, FACS General, Bariatric, & Minimally Invasive Surgery [facility], [place]

## 2018-03-07 NOTE — H&P (View-Only) (Signed)
Jerremy Laurel Dimmer Documented: 03/02/2018 2:22 PM Location: [facility] Patient #: [id number] DOB: 1965-12-06 Single / Language: Cleophus Molt / Race: Black or African American Male   History of Present Illness Randall Hiss M. [place]ong[place] Ponder MD; 03/02/2018 3:10 PM) The patient is a 53 year old male who presents with an inguinal hernia. He comes in for evaluation of a left inguinal hernia as well as a umbilical hernia. He has been twice in the emergency room in the past 6-8 weeks. He has had a long-standing umbilical hernia however it has started to bother him recently. He describes it as a mild soreness and discomfort. It is generally able to be reduced. However most recently has been having issues with swelling in his left groin. He has daily discomfort. It will generally go down at night but become larger and symptomatic throughout the day. He describes a pulling and taking sensation but at times it is burning, shooting pain especially when it is very swollen. He denies any nausea, vomiting, diarrhea or constipation. He had a prior right inguinal hernia repair as a child. He denies any chest pain or chest pressure. He denies any tobacco use. He denies any dysuria   Problem List/Past Medical Randall Hiss M. Redmond Pulling, MD; 2/99/2426 8:34 PM) UMBILICAL HERNIA WITHOUT OBSTRUCTION OR GANGRENE (K42.9)  LEFT INGUINAL HERNIA (K40.90)   Past Surgical History Nance Pew, CMA; 03/02/2018 2:22 PM) Open Inguinal Hernia Surgery  Right.  Diagnostic Studies History Nance Pew, [place]; 03/02/2018 2:22 PM) Colonoscopy  5-10 years ago  Allergies Nance Pew, CMA; 03/02/2018 2:23 PM) No Known Allergies [03/02/2018]: No Known Drug Allergies [03/02/2018]: Allergies Reconciled   Medication History Nance Pew, CMA; 03/02/2018 2:23 PM) metFORMIN HCl (1000MG  Tablet, Oral) Active. hydroCHLOROthiazide (25MG  Tablet, Oral) Active. Medications Reconciled  Social History Nance Pew, CMA; 03/02/2018 2:22  PM) Alcohol use  Remotely quit alcohol use. Caffeine use  Carbonated beverages, Coffee, Tea. No drug use  Tobacco use  Never smoker.  Family History Nance Pew, [place]; 03/02/2018 2:22 PM) Arthritis  Mother. Cancer  Mother. Diabetes Mellitus  Brother, Mother, Sister. Hypertension  Brother, Mother, Sister. Migraine Headache  Mother.  Other Problems Randall Hiss M. Redmond Pulling, MD; 03/02/2018 3:13 PM) Diabetes Mellitus  Gastroesophageal Reflux Disease  High blood pressure  Inguinal Hernia     Review of Systems ([place]; 03/02/2018 2:22 PM) General Not Present- Appetite Loss, Chills, Fatigue, Fever, Night Sweats, Weight Gain and Weight Loss. Skin Not Present- Change in Wart/Mole, Dryness, Hives, Jaundice, New Lesions, Non-Healing Wounds, Rash and Ulcer. HEENT Present- Seasonal Allergies. Not Present- Earache, Hearing Loss, Hoarseness, Nose Bleed, Oral Ulcers, Ringing in the Ears, Sinus Pain, Sore Throat, Visual Disturbances, Wears glasses/contact lenses and Yellow Eyes. Respiratory Not Present- Bloody sputum, Chronic Cough, Difficulty Breathing, Snoring and Wheezing. Breast Not Present- Breast Mass, Breast Pain, Nipple Discharge and Skin Changes. Cardiovascular Not Present- Chest Pain, Difficulty Breathing Lying Down, Leg Cramps, Palpitations, Rapid Heart Rate, Shortness of Breath and Swelling of Extremities. Gastrointestinal Present- Abdominal Pain and Constipation. Not Present- Bloating, Bloody Stool, Change in Bowel Habits, Chronic diarrhea, Difficulty Swallowing, Excessive gas, Gets full quickly at meals, Hemorrhoids, Indigestion, Nausea, Rectal Pain and Vomiting. Male Genitourinary Present- Change in Urinary Stream, Frequency, Nocturia and Urgency. Not Present- Blood in Urine, Impotence, Painful Urination and Urine Leakage. Neurological Present- Headaches. Not Present- Decreased Memory, Fainting, Numbness, Seizures, Tingling, Tremor, Trouble walking and Weakness. Psychiatric  Not Present- Anxiety, Bipolar, Change in Sleep Pattern, Depression, Fearful and Frequent crying. Hematology Not Present- Blood Thinners, Easy Bruising,  Excessive bleeding, Gland problems, HIV and Persistent Infections.  Vitals (Sabrina Canty CMA; 03/02/2018 2:24 PM) 03/02/2018 2:23 PM Weight: 214.13 lb Height: 70in Body Surface Area: 2.15 m Body Mass Index: 30.72 kg/m  Temp.: 96.18F(Temporal)  Pulse: 72 (Regular)  P.OX: 99% (Room air) BP: 168/88 (Sitting, Left Arm, Standard)       Physical Exam Randall Hiss M. Stafford Riviera MD; 03/02/2018 3:11 PM) General Mental Status-Alert. General Appearance-Consistent with stated age. Hydration-Well hydrated. Voice-Normal.  Head and Neck Head-normocephalic, atraumatic with no lesions or palpable masses. Trachea-midline. Thyroid Gland Characteristics - normal size and consistency.  Eye Eyeball - Bilateral-Extraocular movements intact. Sclera/Conjunctiva - Bilateral-No scleral icterus.  Chest and Lung Exam Chest and lung exam reveals -quiet, even and easy respiratory effort with no use of accessory muscles and on auscultation, normal breath sounds, no adventitious sounds and normal vocal resonance. Inspection Chest Wall - Normal. Back - normal.  Breast - Did not examine.  Cardiovascular Cardiovascular examination reveals -normal heart sounds, regular rate and rhythm with no murmurs and normal pedal pulses bilaterally.  Abdomen Inspection  Inspection of the abdomen reveals: Note: Positive umbilical bulge. Soft, nontender. Reducible. Fascial defect about 2 cm. Skin - Scar - no surgical scars. Palpation/Percussion Palpation and Percussion of the abdomen reveal - Soft, Non Tender, No Rebound tenderness, No Rigidity (guarding) and No hepatosplenomegaly. Auscultation Auscultation of the abdomen reveals - Bowel sounds normal.  Male Genitourinary Note: Both testicles descended. Obvious left groin bulge. Easily  reducible.   Peripheral Vascular Upper Extremity Palpation - Pulses bilaterally normal.  Neurologic Neurologic evaluation reveals -alert and oriented x 3 with no impairment of recent or remote memory. Mental Status-Normal.  Neuropsychiatric The patient's mood and affect are described as -normal. Judgment and Insight-insight is appropriate concerning matters relevant to self.  Musculoskeletal Normal Exam - Left-Upper Extremity Strength Normal and Lower Extremity Strength Normal. Normal Exam - Right-Upper Extremity Strength Normal and Lower Extremity Strength Normal.  Lymphatic Head & Neck  General Head & Neck Lymphatics: Bilateral - Description - Normal. Axillary - Did not examine. Femoral & Inguinal - Did not examine.    Assessment & Plan Randall Hiss M. Beatriz Quintela MD; 08/03/348 0:93 PM) UMBILICAL HERNIA WITHOUT OBSTRUCTION OR GANGRENE (K42.9) Impression: The patient is interested in undergoing umbilical hernia repair as well as left inguinal hernia repair. Since he has both an umbilical hernia and a inguinal hernia I recommended laparoscopic approach to repair the inguinal hernia and therefore we can use the pre-existing fascial defect at the umbilicus for access to the abdomen. We can then close the umbilical hernia on the way out. Given the size of the umbilical hernia I have recommended primary repair with mesh underlay which we would do laparoscopically. He was shown an actual piece of mesh.  We discussed the etiology of umbilical hernias. We discussed the signs and symptoms of incarceration and strangulation. The patient was given educational material. I also drew diagrams.  We discussed nonoperative and operative management. With respect to operative management, we discussed open repair  We discussed the risk and benefits of surgery including but not limited to bleeding, infection, injury to surrounding structures, hernia recurrence, mesh complications, hematoma/seroma  formation, blood clot formation, urinary retention, post operative ileus, general anesthesia risk, abdominal pain. We discussed the importance of avoiding heavy lifting and straining for a period of 6 weeks.  The patient has elected to proceed with LAP ASSIST REPAIR OF UMBILICAL HERNIA with MESH. Current Plans Pt Education - Pamphlet Given - Hernia Surgery: discussed with  patient and provided information. LEFT INGUINAL HERNIA (K40.90) Impression: We discussed the etiology of inguinal hernias. We discussed the signs & symptoms of incarceration & strangulation. We discussed non-operative and operative management. We discussed both open and laparoscopic approaches. However since he has an umbilical hernia I recommended a laparoscopic inguinal hernia repair.  The patient has elected to proceed with laparoscopic repair of left inguinal hernia mesh with laparoscopic-assisted repair of umbilical hernia with mesh   I described the procedure in detail. The patient was given educational material. We discussed the risks and benefits including but not limited to bleeding, infection, chronic inguinal pain, nerve entrapment, hernia recurrence, mesh complications, hematoma formation, urinary retention, injury to the testicles or the ovaries, numbness in the groin, blood clots, injury to the surrounding structures, and anesthesia risk. We also discussed the typical post operative recovery course, including no heavy lifting for 6 weeks. I explained that the likelihood of improvement of their symptoms is good Current Plans Pt Education - Pamphlet Given - Laparoscopic Hernia Repair: discussed with patient and provided information.  Leighton Ruff. Redmond Pulling, MD, FACS General, Bariatric, & Minimally Invasive Surgery [facility], [place]

## 2018-03-22 ENCOUNTER — Encounter (HOSPITAL_COMMUNITY): Payer: Self-pay

## 2018-03-22 ENCOUNTER — Encounter (HOSPITAL_COMMUNITY)
Admission: RE | Admit: 2018-03-22 | Discharge: 2018-03-22 | Disposition: A | Discharge status: Home / Self Care | Payer: Self-pay | Source: Ambulatory Visit | Attending: General Surgery | Admitting: General Surgery

## 2018-03-22 ENCOUNTER — Other Ambulatory Visit: Payer: Self-pay

## 2018-03-22 ENCOUNTER — Encounter (HOSPITAL_BASED_OUTPATIENT_CLINIC_OR_DEPARTMENT_OTHER): Payer: Self-pay | Admitting: *Deleted

## 2018-03-22 DIAGNOSIS — Z01818 Encounter for other preprocedural examination: Secondary | ICD-10-CM | POA: Insufficient documentation

## 2018-03-22 HISTORY — DX: Abnormal electrocardiogram (ECG) (EKG): R94.31

## 2018-03-22 LAB — BASIC METABOLIC PANEL
Anion gap: 8 (ref 5–15)
BUN: 20 mg/dL (ref 6–20)
CO2: 25 mmol/L (ref 22–32)
Calcium: 9.1 mg/dL (ref 8.9–10.3)
Chloride: 104 mmol/L (ref 98–111)
Creatinine, Ser: 1.41 mg/dL — ABNORMAL HIGH (ref 0.61–1.24)
GFR calc Af Amer: >60 mL/min (ref >60)
GFR calc non Af Amer: 57 mL/min — ABNORMAL LOW (ref >60)
Glucose, Bld: 153 mg/dL — ABNORMAL HIGH (ref 70–99)
POTASSIUM: 4 mmol/L (ref 3.5–5.1)
Sodium: 137 mmol/L (ref 135–145)

## 2018-03-22 NOTE — Progress Notes (Signed)
SPOKE WITH Stanley Chapman NPO AFTER MIDNIGHT FOOD, CLEAR LIQUIDS UNTIL 1000 AM AND DRINK PRESURGERY SHAKE AT 1000 AM 03-26-2008, THEN NPO MEDS TO TAKE : DOXAZOSIN, CARDURA,  DRIVER SISTER Stanley Chapman HAS SURGERY ORDERS IN Epic HAS PRE OP LAB APPOINTMENT FOR BMET AND EKG AND PICK UP PRESURGERY SHAKE AT 200 PM 03-22-2018

## 2018-03-26 ENCOUNTER — Ambulatory Visit (HOSPITAL_BASED_OUTPATIENT_CLINIC_OR_DEPARTMENT_OTHER)
Admission: RE | Admit: 2018-03-26 | Discharge: 2018-03-26 | Disposition: A | Discharge status: Home / Self Care | Payer: Self-pay | Source: Ambulatory Visit | Attending: General Surgery | Admitting: General Surgery

## 2018-03-26 ENCOUNTER — Encounter (HOSPITAL_BASED_OUTPATIENT_CLINIC_OR_DEPARTMENT_OTHER): Payer: Self-pay | Admitting: Anesthesiology

## 2018-03-26 ENCOUNTER — Encounter (HOSPITAL_BASED_OUTPATIENT_CLINIC_OR_DEPARTMENT_OTHER)
Admission: RE | Disposition: A | Discharge status: Home / Self Care | Payer: Self-pay | Source: Ambulatory Visit | Attending: General Surgery

## 2018-03-26 ENCOUNTER — Ambulatory Visit (HOSPITAL_BASED_OUTPATIENT_CLINIC_OR_DEPARTMENT_OTHER): Payer: Self-pay | Admitting: Certified Registered"

## 2018-03-26 DIAGNOSIS — Z79899 Other long term (current) drug therapy: Secondary | ICD-10-CM | POA: Insufficient documentation

## 2018-03-26 DIAGNOSIS — E119 Type 2 diabetes mellitus without complications: Secondary | ICD-10-CM | POA: Insufficient documentation

## 2018-03-26 DIAGNOSIS — E785 Hyperlipidemia, unspecified: Secondary | ICD-10-CM | POA: Insufficient documentation

## 2018-03-26 DIAGNOSIS — K4091 Unilateral inguinal hernia, without obstruction or gangrene, recurrent: Secondary | ICD-10-CM | POA: Insufficient documentation

## 2018-03-26 DIAGNOSIS — I10 Essential (primary) hypertension: Secondary | ICD-10-CM | POA: Insufficient documentation

## 2018-03-26 DIAGNOSIS — Z7984 Long term (current) use of oral hypoglycemic drugs: Secondary | ICD-10-CM | POA: Insufficient documentation

## 2018-03-26 DIAGNOSIS — K429 Umbilical hernia without obstruction or gangrene: Secondary | ICD-10-CM | POA: Insufficient documentation

## 2018-03-26 DIAGNOSIS — K219 Gastro-esophageal reflux disease without esophagitis: Secondary | ICD-10-CM | POA: Insufficient documentation

## 2018-03-26 HISTORY — DX: Gastro-esophageal reflux disease without esophagitis: K21.9

## 2018-03-26 HISTORY — PX: LAPAROSCOPIC INGUINAL HERNIA WITH UMBILICAL HERNIA: SHX5658

## 2018-03-26 LAB — GLUCOSE, CAPILLARY
Glucose-Capillary: 156 mg/dL — ABNORMAL HIGH (ref 70–99)
Glucose-Capillary: 106 mg/dL — ABNORMAL HIGH (ref 70–99)

## 2018-03-26 SURGERY — LAPAROSCOPIC INGUINAL HERNIA WITH UMBILICAL HERNIA
Anesthesia: General | Site: Abdomen | Laterality: Left

## 2018-03-26 MED ORDER — GABAPENTIN 300 MG PO CAPS
300.0000 mg | ORAL_CAPSULE | ORAL | Status: AC
  Administered 2018-03-26: 300 mg via ORAL
  Filled 2018-03-26: qty 1

## 2018-03-26 MED ORDER — SCOPOLAMINE 1 MG/3DAYS TD PT72
1.0000 | MEDICATED_PATCH | TRANSDERMAL | Status: DC
  Administered 2018-03-26: 1.5 mg via TRANSDERMAL
  Filled 2018-03-26: qty 1

## 2018-03-26 MED ORDER — CEFAZOLIN SODIUM-DEXTROSE 2-4 GM/100ML-% IV SOLN
INTRAVENOUS | Status: AC
  Filled 2018-03-26: qty 100

## 2018-03-26 MED ORDER — PROPOFOL 10 MG/ML IV BOLUS
INTRAVENOUS | Status: AC
  Filled 2018-03-26: qty 40

## 2018-03-26 MED ORDER — MIDAZOLAM HCL 2 MG/2ML IJ SOLN
INTRAMUSCULAR | Status: DC | PRN
  Administered 2018-03-26: 2 mg via INTRAVENOUS

## 2018-03-26 MED ORDER — LIDOCAINE 2% (20 MG/ML) 5 ML SYRINGE
INTRAMUSCULAR | Status: DC | PRN
  Administered 2018-03-26: 80 mg via INTRAVENOUS

## 2018-03-26 MED ORDER — LIDOCAINE 2% (20 MG/ML) 5 ML SYRINGE
INTRAMUSCULAR | Status: AC
  Filled 2018-03-26: qty 5

## 2018-03-26 MED ORDER — MIDAZOLAM HCL 2 MG/2ML IJ SOLN
INTRAMUSCULAR | Status: AC
  Filled 2018-03-26: qty 2

## 2018-03-26 MED ORDER — ROCURONIUM BROMIDE 100 MG/10ML IV SOLN
INTRAVENOUS | Status: AC
  Filled 2018-03-26: qty 1

## 2018-03-26 MED ORDER — DEXAMETHASONE SODIUM PHOSPHATE 10 MG/ML IJ SOLN
INTRAMUSCULAR | Status: DC | PRN
  Administered 2018-03-26: 10 mg via INTRAVENOUS

## 2018-03-26 MED ORDER — CHLORHEXIDINE GLUCONATE CLOTH 2 % EX PADS
6.0000 | MEDICATED_PAD | Freq: Once | CUTANEOUS | Status: DC
  Filled 2018-03-26: qty 6

## 2018-03-26 MED ORDER — FENTANYL CITRATE (PF) 100 MCG/2ML IJ SOLN
INTRAMUSCULAR | Status: DC | PRN
  Administered 2018-03-26 (×2): 50 ug via INTRAVENOUS
  Administered 2018-03-26: 100 ug via INTRAVENOUS

## 2018-03-26 MED ORDER — SCOPOLAMINE 1 MG/3DAYS TD PT72
MEDICATED_PATCH | TRANSDERMAL | Status: AC
  Filled 2018-03-26: qty 1

## 2018-03-26 MED ORDER — SUGAMMADEX SODIUM 200 MG/2ML IV SOLN
INTRAVENOUS | Status: AC
  Filled 2018-03-26: qty 2

## 2018-03-26 MED ORDER — DEXAMETHASONE SODIUM PHOSPHATE 10 MG/ML IJ SOLN
INTRAMUSCULAR | Status: AC
  Filled 2018-03-26: qty 1

## 2018-03-26 MED ORDER — GABAPENTIN 300 MG PO CAPS
ORAL_CAPSULE | ORAL | Status: AC
  Filled 2018-03-26: qty 1

## 2018-03-26 MED ORDER — LACTATED RINGERS IV SOLN
INTRAVENOUS | Status: DC
  Administered 2018-03-26 (×2): via INTRAVENOUS
  Filled 2018-03-26: qty 1000

## 2018-03-26 MED ORDER — ROCURONIUM BROMIDE 10 MG/ML (PF) SYRINGE
PREFILLED_SYRINGE | INTRAVENOUS | Status: DC | PRN
  Administered 2018-03-26: 10 mg via INTRAVENOUS
  Administered 2018-03-26: 50 mg via INTRAVENOUS
  Administered 2018-03-26 (×2): 10 mg via INTRAVENOUS

## 2018-03-26 MED ORDER — OXYCODONE HCL 5 MG PO TABS: 5.0000 mg | ORAL_TABLET | Freq: Four times a day (QID) | ORAL | 0 refills | Status: DC | PRN

## 2018-03-26 MED ORDER — ONDANSETRON HCL 4 MG/2ML IJ SOLN
INTRAMUSCULAR | Status: AC
  Filled 2018-03-26: qty 2

## 2018-03-26 MED ORDER — FENTANYL CITRATE (PF) 250 MCG/5ML IJ SOLN
INTRAMUSCULAR | Status: AC
  Filled 2018-03-26: qty 5

## 2018-03-26 MED ORDER — KETOROLAC TROMETHAMINE 30 MG/ML IJ SOLN
INTRAMUSCULAR | Status: AC
  Filled 2018-03-26: qty 1

## 2018-03-26 MED ORDER — BUPIVACAINE-EPINEPHRINE 0.25% -1:200000 IJ SOLN
INTRAMUSCULAR | Status: DC | PRN
  Administered 2018-03-26: 60 mL

## 2018-03-26 MED ORDER — KETOROLAC TROMETHAMINE 30 MG/ML IJ SOLN
INTRAMUSCULAR | Status: DC | PRN
  Administered 2018-03-26: 30 mg via INTRAVENOUS

## 2018-03-26 MED ORDER — ONDANSETRON HCL 4 MG/2ML IJ SOLN
INTRAMUSCULAR | Status: DC | PRN
  Administered 2018-03-26: 4 mg via INTRAVENOUS

## 2018-03-26 MED ORDER — ACETAMINOPHEN 500 MG PO TABS
ORAL_TABLET | ORAL | Status: AC
  Filled 2018-03-26: qty 2

## 2018-03-26 MED ORDER — MEPERIDINE HCL 25 MG/ML IJ SOLN
6.2500 mg | INTRAMUSCULAR | Status: DC | PRN
  Filled 2018-03-26: qty 1

## 2018-03-26 MED ORDER — SUGAMMADEX SODIUM 200 MG/2ML IV SOLN
INTRAVENOUS | Status: DC | PRN
  Administered 2018-03-26: 200 mg via INTRAVENOUS

## 2018-03-26 MED ORDER — PROPOFOL 10 MG/ML IV BOLUS
INTRAVENOUS | Status: DC | PRN
  Administered 2018-03-26: 200 mg via INTRAVENOUS

## 2018-03-26 MED ORDER — ARTIFICIAL TEARS OPHTHALMIC OINT
TOPICAL_OINTMENT | OPHTHALMIC | Status: AC
  Filled 2018-03-26: qty 3.5

## 2018-03-26 MED ORDER — ACETAMINOPHEN 500 MG PO TABS
1000.0000 mg | ORAL_TABLET | ORAL | Status: AC
  Administered 2018-03-26: 1000 mg via ORAL
  Filled 2018-03-26: qty 2

## 2018-03-26 MED ORDER — OXYCODONE HCL 5 MG PO TABS
5.0000 mg | ORAL_TABLET | Freq: Once | ORAL | Status: DC | PRN
  Filled 2018-03-26: qty 1

## 2018-03-26 MED ORDER — FENTANYL CITRATE (PF) 100 MCG/2ML IJ SOLN
25.0000 ug | INTRAMUSCULAR | Status: DC | PRN
  Filled 2018-03-26: qty 1

## 2018-03-26 MED ORDER — OXYCODONE HCL 5 MG/5ML PO SOLN
5.0000 mg | Freq: Once | ORAL | Status: DC | PRN
  Filled 2018-03-26: qty 5

## 2018-03-26 MED ORDER — ONDANSETRON HCL 4 MG/2ML IJ SOLN
4.0000 mg | Freq: Once | INTRAMUSCULAR | Status: DC | PRN
  Filled 2018-03-26: qty 2

## 2018-03-26 MED ORDER — METOPROLOL TARTRATE 5 MG/5ML IV SOLN
INTRAVENOUS | Status: AC
  Filled 2018-03-26: qty 5

## 2018-03-26 MED ORDER — CEFAZOLIN SODIUM-DEXTROSE 2-4 GM/100ML-% IV SOLN
2.0000 g | INTRAVENOUS | Status: AC
  Administered 2018-03-26: 2 g via INTRAVENOUS
  Filled 2018-03-26: qty 100

## 2018-03-26 MED ORDER — METOPROLOL TARTRATE 5 MG/5ML IV SOLN
INTRAVENOUS | Status: DC | PRN
  Administered 2018-03-26: 1 mg via INTRAVENOUS

## 2018-03-26 SURGICAL SUPPLY — 58 items
ADH SKN CLS APL DERMABOND .7 (GAUZE/BANDAGES/DRESSINGS) ×1
APL SKNCLS STERI-STRIP NONHPOA (GAUZE/BANDAGES/DRESSINGS) ×1
APPLIER CLIP 5 13 M/L LIGAMAX5 (MISCELLANEOUS)
APR CLP MED LRG 5 ANG JAW (MISCELLANEOUS)
BANDAGE ADH SHEER 1  50/CT (GAUZE/BANDAGES/DRESSINGS) IMPLANT
BENZOIN TINCTURE PRP APPL 2/3 (GAUZE/BANDAGES/DRESSINGS) ×3 IMPLANT
BLADE HEX COATED 2.75 (ELECTRODE) ×3 IMPLANT
CABLE HIGH FREQUENCY MONO STRZ (ELECTRODE) ×3 IMPLANT
CHLORAPREP W/TINT 26ML (MISCELLANEOUS) ×3 IMPLANT
CLIP APPLIE 5 13 M/L LIGAMAX5 (MISCELLANEOUS) IMPLANT
CLOSURE WOUND 1/2 X4 (GAUZE/BANDAGES/DRESSINGS) ×1
COVER SURGICAL LIGHT HANDLE (MISCELLANEOUS) ×3 IMPLANT
COVER WAND RF STERILE (DRAPES) IMPLANT
DECANTER SPIKE VIAL GLASS SM (MISCELLANEOUS) ×3 IMPLANT
DERMABOND ADVANCED (GAUZE/BANDAGES/DRESSINGS) ×2
DERMABOND ADVANCED .7 DNX12 (GAUZE/BANDAGES/DRESSINGS) ×1 IMPLANT
DEVICE SECURE STRAP 25 ABSORB (INSTRUMENTS) ×3 IMPLANT
DRSG COVADERM PLUS 2X2 (GAUZE/BANDAGES/DRESSINGS) IMPLANT
DRSG TEGADERM 2-3/8X2-3/4 SM (GAUZE/BANDAGES/DRESSINGS) ×3 IMPLANT
DRSG TEGADERM 4X4.75 (GAUZE/BANDAGES/DRESSINGS) IMPLANT
ELECT PENCIL ROCKER SW 15FT (MISCELLANEOUS) ×3 IMPLANT
ENDOLOOP SUT PDS II  0 18 (SUTURE) ×2
ENDOLOOP SUT PDS II 0 18 (SUTURE) ×1 IMPLANT
Endoloop Vicryl ×3 IMPLANT
GAUZE SPONGE 2X2 8PLY STRL LF (GAUZE/BANDAGES/DRESSINGS) IMPLANT
GLOVE BIO SURGEON STRL SZ7.5 (GLOVE) ×3 IMPLANT
GLOVE BIOGEL M STER SZ 6 (GLOVE) ×3 IMPLANT
GLOVE BIOGEL PI IND STRL 6.5 (GLOVE) ×1 IMPLANT
GLOVE BIOGEL PI IND STRL 7.0 (GLOVE) ×1 IMPLANT
GLOVE BIOGEL PI IND STRL 7.5 (GLOVE) ×2 IMPLANT
GLOVE BIOGEL PI INDICATOR 6.5 (GLOVE) ×2
GLOVE BIOGEL PI INDICATOR 7.0 (GLOVE) ×2
GLOVE BIOGEL PI INDICATOR 7.5 (GLOVE) ×4
GLOVE INDICATOR 8.0 STRL GRN (GLOVE) ×3 IMPLANT
GOWN STRL REUS W/TWL XL LVL3 (GOWN DISPOSABLE) ×9 IMPLANT
L-HOOK LAP DISP 36CM (ELECTROSURGICAL)
LHOOK LAP DISP 36CM (ELECTROSURGICAL) IMPLANT
MESH 3DMAX 4X6 LT LRG (Mesh General) ×3 IMPLANT
PACK BASIN DAY SURGERY FS (CUSTOM PROCEDURE TRAY) ×3 IMPLANT
SCISSORS LAP 5X35 DISP (ENDOMECHANICALS) ×3 IMPLANT
SET IRRIG TUBING LAPAROSCOPIC (IRRIGATION / IRRIGATOR) IMPLANT
SET TUBE SMOKE EVAC HIGH FLOW (TUBING) IMPLANT
SLEEVE XCEL OPT CAN 5 100 (ENDOMECHANICALS) IMPLANT
SPONGE GAUZE 2X2 8PLY STER LF (GAUZE/BANDAGES/DRESSINGS) ×1
SPONGE GAUZE 2X2 8PLY STRL LF (GAUZE/BANDAGES/DRESSINGS) ×2 IMPLANT
SPONGE GAUZE 2X2 STER 10/PKG (GAUZE/BANDAGES/DRESSINGS)
STRIP CLOSURE SKIN 1/2X4 (GAUZE/BANDAGES/DRESSINGS) ×2 IMPLANT
SUT MNCRL AB 4-0 PS2 18 (SUTURE) ×3 IMPLANT
SUT NOVA NAB DX-16 0-1 5-0 T12 (SUTURE) IMPLANT
SUT NOVA NAB GS-21 0 18 T12 DT (SUTURE) ×3 IMPLANT
SUT VIC AB 3-0 SH 8-18 (SUTURE) ×2 IMPLANT
SUT VICRYL 0 ENDOLOOP (SUTURE) ×3 IMPLANT
TAPE STRIPS DRAPE STRL (GAUZE/BANDAGES/DRESSINGS) ×3 IMPLANT
TOWEL OR 17X26 10 PK STRL BLUE (TOWEL DISPOSABLE) ×3 IMPLANT
TRAY LAPAROSCOPIC (CUSTOM PROCEDURE TRAY) ×3 IMPLANT
TROCAR BLADELESS OPT 5 100 (ENDOMECHANICALS) ×9 IMPLANT
TROCAR XCEL BLUNT TIP 100MML (ENDOMECHANICALS) ×3 IMPLANT
TUBING EVAC SMOKE HEATED PNEUM (TUBING) ×3 IMPLANT

## 2018-03-26 NOTE — Discharge Instructions (Signed)
[facility]  UMBILICAL OR INGUINAL HERNIA REPAIR: POST OP INSTRUCTIONS  Always review your discharge instruction sheet given to you by the facility where your surgery was performed. IF YOU HAVE DISABILITY OR FAMILY LEAVE FORMS, YOU MUST BRING THEM TO THE OFFICE FOR PROCESSING.   DO NOT GIVE THEM TO YOUR DOCTOR.  1. SEE ATTACHED PAIN CONTROL HANDOUT 2. Take your usually prescribed medications unless otherwise directed. 3. If you need a refill on your pain medication, please contact your pharmacy.  They will contact our office to request authorization. Prescriptions will not be filled after 5 pm or on week-ends. 4. You should follow a light diet the first 24 hours after arrival home, such as soup and crackers, etc.  Be sure to include lots of fluids daily.  Resume your normal diet the day after surgery. 5. Most patients will experience some swelling and bruising around the umbilicus or in the groin and scrotum.  Ice packs and reclining will help.  Swelling and bruising can take several days to resolve.  6. It is common to experience some constipation if taking pain medication after surgery.  Increasing fluid intake and taking a stool softener (such as Colace) will usually help or prevent this problem from occurring.  A mild laxative (Milk of Magnesia or Miralax) should be taken according to package directions if there are no bowel movements after 48 hours. 7. Unless discharge instructions indicate otherwise, you may remove your bandages 48 hours after surgery, and you may shower at that time.  You  have steri-strips (small skin tapes) in place directly over the incision.  These strips should be left on the skin for 7-10 days.  8. ACTIVITIES:  You may resume regular (light) daily activities beginning the next day--such as daily self-care, walking, climbing stairs--gradually increasing activities as tolerated.  You may have sexual intercourse when it is comfortable.  Refrain from any  heavy lifting or straining until approved by your doctor. a. You may drive when you are no longer taking prescription pain medication, you can comfortably wear a seatbelt, and you can safely maneuver your car and apply brakes. b. RETURN TO WORK:  9. You should see your doctor in the office for a follow-up appointment approximately 2-3 weeks after your surgery.  Make sure that you call for this appointment within a day or two after you arrive home to insure a convenient appointment time. 10. OTHER INSTRUCTIONS: DO NOT LIFT/PUSH/PULL ANYTHING GREATER THAN 10 LBS X 6 WEEKS    WHEN TO CALL YOUR DOCTOR: 1. Fever over 101.0 2. Inability to urinate 3. Nausea and/or vomiting 4. Extreme swelling or bruising 5. Continued bleeding from incision. 6. Increased pain, redness, or drainage from the incision  The clinic staff is available to answer your questions during regular business hours.  Please dont hesitate to call and ask to speak to one of the nurses for clinical concerns.  If you have a medical emergency, go to the nearest emergency room or call 911.  A surgeon from [facility] is always on call at the hospital   [address] [place], [place], [place]  [address] ?  [address], [place], [place]   [address] [phone] ? [phone] ? FAX [phone] Web site: [email or web address]     Managing Your Pain After Surgery Without Opioids    Thank you for participating in our program to help patients manage their pain after surgery without opioids. This is part of our  effort to provide you with the best care possible, without exposing you or your family to the risk that opioids pose.  What pain can I expect after surgery? You can expect to have some pain after surgery. This is normal. The pain is typically worse the day after surgery, and quickly begins to get better. Many studies have found that many patients are able to manage their pain after  surgery with Over-the-Counter (OTC) medications such as Tylenol and Motrin. If you have a condition that does not allow you to take Tylenol or Motrin, notify your surgical team.  How will I manage my pain? The best strategy for controlling your pain after surgery is around the clock pain control with Tylenol (acetaminophen) and Motrin (ibuprofen or Advil). Alternating these medications with each other allows you to maximize your pain control. In addition to Tylenol and Motrin, you can use heating pads or ice packs on your incisions to help reduce your pain.  How will I alternate your regular strength over-the-counter pain medication? You will take a dose of pain medication every three hours. ; Start by taking 650 mg of Tylenol (2 pills of 325 mg) ; 3 hours later take 600 mg of Motrin (3 pills of 200 mg) ; 3 hours after taking the Motrin take 650 mg of Tylenol ; 3 hours after that take 600 mg of Motrin.   - 1 -  See example - if your first dose of Tylenol is at 12:00 PM   12:00 PM Tylenol 650 mg (2 pills of 325 mg)  3:00 PM Motrin 600 mg (3 pills of 200 mg)  6:00 PM Tylenol 650 mg (2 pills of 325 mg)  9:00 PM Motrin 600 mg (3 pills of 200 mg)  Continue alternating every 3 hours   We recommend that you follow this schedule around-the-clock for at least 3 days after surgery, or until you feel that it is no longer needed. Use the table on the last page of this handout to keep track of the medications you are taking. Important: Do not take more than 3000mg  of Tylenol or 2400mg  of Motrin in a 24-hour period. Do not take ibuprofen/Motrin if you have a history of bleeding stomach ulcers, severe kidney disease, &/or actively taking a blood thinner  What if I still have pain? If you have pain that is not controlled with the over-the-counter pain medications (Tylenol and Motrin or Advil) you might have what we call breakthrough pain. You will receive a prescription for a small amount of an  opioid pain medication such as Oxycodone, Tramadol, or Tylenol with Codeine. Use these opioid pills in the first 24 hours after surgery if you have breakthrough pain. Do not take more than 1 pill every 4-6 hours.  If you still have uncontrolled pain after using all opioid pills, don't hesitate to call our staff using the number provided. We will help make sure you are managing your pain in the best way possible, and if necessary, we can provide a prescription for additional pain medication.   Day 1    Time  Name of Medication Number of pills taken  Amount of Acetaminophen  Pain Level   Comments  AM PM       AM PM       AM PM       AM PM       AM PM       AM PM       AM PM  AM PM       Total Daily amount of Acetaminophen Do not take more than  3,000 mg per day      Day 2    Time  Name of Medication Number of pills taken  Amount of Acetaminophen  Pain Level   Comments  AM PM       AM PM       AM PM       AM PM       AM PM       AM PM       AM PM       AM PM       Total Daily amount of Acetaminophen Do not take more than  3,000 mg per day      Day 3    Time  Name of Medication Number of pills taken  Amount of Acetaminophen  Pain Level   Comments  AM PM       AM PM       AM PM       AM PM          AM PM       AM PM       AM PM       AM PM       Total Daily amount of Acetaminophen Do not take more than  3,000 mg per day      Day 4    Time  Name of Medication Number of pills taken  Amount of Acetaminophen  Pain Level   Comments  AM PM       AM PM       AM PM       AM PM       AM PM       AM PM       AM PM       AM PM       Total Daily amount of Acetaminophen Do not take more than  3,000 mg per day      Day 5    Time  Name of Medication Number of pills taken  Amount of Acetaminophen  Pain Level   Comments  AM PM       AM PM       AM PM       AM PM       AM PM       AM PM       AM PM       AM PM       Total Daily  amount of Acetaminophen Do not take more than  3,000 mg per day       Day 6    Time  Name of Medication Number of pills taken  Amount of Acetaminophen  Pain Level  Comments  AM PM       AM PM       AM PM       AM PM       AM PM       AM PM       AM PM       AM PM       Total Daily amount of Acetaminophen Do not take more than  3,000 mg per day      Day 7    Time  Name of Medication Number of pills taken  Amount of Acetaminophen  Pain Level  Comments  AM PM       AM PM       AM PM       AM PM       AM PM       AM PM       AM PM       AM PM       Total Daily amount of Acetaminophen Do not take more than  3,000 mg per day        For additional information about how and where to safely dispose of unused opioid medications - [employer].com.[employer]  Disclaimer: This document contains information and/or instructional materials adapted from [place] for the typical patient with your condition. It does not replace medical advice from your health care provider because your experience may differ from that of the typical patient. Talk to your health care provider if you have any questions about this document, your condition or your treatment plan. Adapted from [place] Instructions  Activity: Get plenty of rest for the remainder of the day. A responsible individual must stay with you for 24 hours following the procedure.  For the next 24 hours, DO NOT: -Drive a car -Paediatric nurse -Drink alcoholic beverages -Take any medication unless instructed by your physician -Make any legal decisions or sign important papers.  Meals: Start with liquid foods such as gelatin or soup. Progress to regular foods as tolerated. Avoid greasy, spicy, heavy foods. If nausea and/or vomiting occur, drink only clear liquids until the nausea and/or vomiting subsides. Call your physician if vomiting continues.  Special  Instructions/Symptoms: Your throat may feel dry or sore from the anesthesia or the breathing tube placed in your throat during surgery. If this causes discomfort, gargle with warm salt water. The discomfort should disappear within 24 hours.     May take Ibuprofen, Advil, Motrin, or Aleve after 7 PM.

## 2018-03-26 NOTE — Anesthesia Postprocedure Evaluation (Signed)
Anesthesia Post Note  Patient: Degan Laurel Dimmer  Procedure(s) Performed: LAPAROSCOPIC LEFT  INGUINAL HERNIA WITH MESH AND  LAPAROSCOPIC ASSISTED REPAIR OF UMBILICAL HERNIA (Left Abdomen)     Patient location during evaluation: PACU Anesthesia Type: General Level of consciousness: awake and alert Pain management: pain level controlled Vital Signs Assessment: post-procedure vital signs reviewed and stable Respiratory status: spontaneous breathing, nonlabored ventilation and respiratory function stable Cardiovascular status: blood pressure returned to baseline and stable Postop Assessment: no apparent nausea or vomiting Anesthetic complications: no    Last Vitals:  Vitals:   03/26/18 1445 03/26/18 1500  BP: (!) 159/91 (!) 167/94  Pulse: 72 88  Resp: 13 14  Temp:    SpO2: 100% 100%    Last Pain:  Vitals:   03/26/18 1445  TempSrc:   PainSc: Asleep                 Lynda Rainwater

## 2018-03-26 NOTE — Anesthesia Preprocedure Evaluation (Signed)
Anesthesia Evaluation  Patient identified by MRN, date of birth, ID band Patient awake    Reviewed: Allergy & Precautions, NPO status , Patient's Chart, lab work & pertinent test results  Airway Mallampati: II  TM Distance: >3 FB Neck ROM: Full    Dental no notable dental hx. (+) Teeth Intact   Pulmonary neg pulmonary ROS,    Pulmonary exam normal breath sounds clear to auscultation       Cardiovascular hypertension, Pt. on medications Normal cardiovascular exam Rhythm:Regular Rate:Normal     Neuro/Psych negative neurological ROS  negative psych ROS   GI/Hepatic negative GI ROS, Neg liver ROS,   Endo/Other  diabetes, Well Controlled, Type 2, Oral Hypoglycemic AgentsHyperlipidemia  Renal/GU negative Renal ROS  negative genitourinary   Musculoskeletal Left inguinal hernia Umbilical hernia   Abdominal   Peds  Hematology negative hematology ROS (+)   Anesthesia Other Findings   Reproductive/Obstetrics                             Anesthesia Physical Anesthesia Plan  ASA: II  Anesthesia Plan: General   Post-op Pain Management:    Induction: Intravenous  PONV Risk Score and Plan: 4 or greater and Midazolam, Ondansetron, Treatment may vary due to age or medical condition and Scopolamine patch - Pre-op  Airway Management Planned: Oral ETT  Additional Equipment:   Intra-op Plan:   Post-operative Plan: Extubation in OR  Informed Consent: I have reviewed the patients History and Physical, chart, labs and discussed the procedure including the risks, benefits and alternatives for the proposed anesthesia with the patient or authorized representative who has indicated his/her understanding and acceptance.     Dental advisory given  Plan Discussed with: CRNA and Surgeon  Anesthesia Plan Comments:         Anesthesia Quick Evaluation

## 2018-03-26 NOTE — Transfer of Care (Signed)
Immediate Anesthesia Transfer of Care Note  Patient: Stanley Chapman  Procedure(s) Performed: LAPAROSCOPIC LEFT  INGUINAL HERNIA WITH MESH AND  LAPAROSCOPIC ASSISTED REPAIR OF UMBILICAL HERNIA (Left Abdomen)  Patient Location: PACU  Anesthesia Type:General  Level of Consciousness: drowsy and patient cooperative  Airway & Oxygen Therapy: Patient Spontanous Breathing and Patient connected to face mask oxygen  Post-op Assessment: Report given to RN and Post -op Vital signs reviewed and stable  Post vital signs: Reviewed and stable  Last Vitals:  Vitals Value Taken Time  BP 147/81 03/26/2018  2:19 PM  Temp    Pulse 83 03/26/2018  2:22 PM  Resp 16 03/26/2018  2:22 PM  SpO2 100 % 03/26/2018  2:22 PM  Vitals shown include unvalidated device data.  Last Pain:  Vitals:   03/26/18 1041  TempSrc: Oral         Complications: No apparent anesthesia complications

## 2018-03-26 NOTE — Op Note (Signed)
03/26/2018  Ostin T Megan Salon July 09, 1965   PREOPERATIVE DIAGNOSIS: Left inguinal hernia, umbilical  POSTOPERATIVE DIAGNOSIS: left recurrent indirect inguinal hernia; umbilical hernia  PROCEDURE: Laparoscopic repair of recurrent Left indirect inguinal hernia with  mesh (TAPP). Primary repair of umbilical hernia  SURGEON: Leighton Ruff. Redmond Pulling, MD FACS  ASSISTANT SURGEON: None.   ANESTHESIA: General plus local consisting of 0.25% Marcaine with epi.   ESTIMATED BLOOD LOSS: Minimal.   FINDINGS: The patient had a recurrent indirect hernia on the left along with a 0.0FV umbilical hernia.  The inguinal was repaired using a Large Bard 3DMaxMesh  SPECIMEN: none  INDICATIONS FOR PROCEDURE: The patient presented to the office with a bulge in his left groin.  He reported a prior inguinal hernia repair many years ago.  He also had an umbilical hernia as well.  It was fat-containing.  He elected to undergo laparoscopic repair of left inguinal hernia and repair of his umbilical hernia with possible mesh. The risks and benefits including but not limited to bleeding, infection, chronic inguinal pain, nerve entrapment, hernia recurrence, mesh complications, hematoma formation, urinary retention, injury to the testicles or the ovaries, numbness in the groin, blood clots, injury to the surrounding structures, and anesthesia risk was discussed with the patient.  DESCRIPTION OF PROCEDURE: After obtaining verbal consent and marking  the left groin in the holding area with the patient confirming the  operative site, the patient was then taken back to the operating room, placed  supine on the operating room table. General endotracheal anesthesia was  established. The patient had emptied their bladder prior to going back to  the operating room. Sequential compression devices were placed. The  abdomen and groin were prepped and draped in the usual standard surgical  fashion with ChloraPrep. The patient received  oralTylenol as well as IV  antibiotics prior to the incision. A surgical time-out was performed.  Local was infiltrated at the base of the umbilicus.   Next, a 3-cm curvilinear infraumbilical incision was made with a #11 blade.  He had a pre-existing umbilical fascial bulge.  I lifted the umbilical stalk away from the herniated fat.  Most of the herniated fat was reduced some was excised with cautery.  This revealed approximately 1.3 cm fascial defect in the umbilicus.  The fascia  was grasped and lifted anteriorly.  Pursestring suture was placed around  the fascial edges using a 0 Vicryl. A 12-mm Hasson trocar was placed.  Pneumoperitoneum was smoothly established up to a patient pressure of 15  mmHg. Laparoscope was advanced. There was no evidence of a  contralateral hernia. The patient had a defect lateral to  the inferior epigastric vessel, consistent with an left recurrent indirect hernia. Two 5-mm trocars were placed, one on the right, one on the left  in the midclavicular line slightly above the level of the umbilicus all  under direct visualization. After local had been infiltrated, I then  made incision along the peritoneum on the left, starting 2 inches above  the anterior superior iliac spine and caring it medial  toward the median umbilical ligament in a lazy S configuration using  Endo Shears with electrocautery. The peritoneal flap was then gently  dissected downward from the anterior abdominal wall taking care not to  injure the inferior epigastric vessels. The pubic bone was identified.  Mobilized the surrounding tissue away from the Cooper's ligament and carried medially toward the pubic symphysis.  The testicular vessels were identified.  Using  traction and counter traction  with short graspers, I reduced the sac in  its entirety. The testicular vessels had been identified and preserved. The vas deferens was identified and preserved, and the hernia sac was stripped from those  to  surrounding structures. I then went about creating a large pocket by  lifting the peritoneum of the pelvic floor. I took great care not to  injure the iliac vessels.    Local anesthetic was injected 2 finger breadths below and medial to the anterior superior iliac spine as well as along the left groin prior to placing the mesh. I then obtained a large piece of Bard 3D max mesh, placed it through the Hasson trocar, half of it covered medial  to the inferior epigastric vessels and half of it lateral to the  inferior epigastric vessels.  The mesh went below the level of the pubic bone Cooper's ligament.  The defect was well  covered with the mesh. I then secured the mesh to the abdominal wall  using an Ethicon secure strap tack. Tack were placed through  the Cooper's ligament, one tack on each side of the inferior epigastric  vessel and 1 tack out laterally. No tacks were placed below the  shelving edge of the inguinal ligament. Pneumoperitoneum was reduced  to 8 mmHg. I then brought the peritoneal flap back up to the abdominal  wall and tacked it to the abdominal wall using 4  tacks. There was a  defect in the peritoneum.I use the reduced hernia sac and tacked it to the abdominal wall to cover the defect in the peritoneum and the mesh was well covered.  I then turned my attention to the fascial defect in the umbilicus.  There was the falciform ligament extending down to the umbilicus.  It was unclear if he had a supraumbilical or epigastric hernia as well.  I placed an additional 5 mm trocar in the right lower quadrant under direct visualization.  I then took down the falciform ligament starting at the level of the umbilicus where the [facility] trocar was placed up toward the liver.  I did not see any additional fascial defects. I removed the  Hasson trocar and tied down the previously placed pursestring suture.  The closure was viewed laparoscopically. There was no evidence of  fascial defect.  There was no air leak at the umbilicus.  Since he had a pre-existing fascial defect of around 1.3 cm I elected to place additional interrupted #1 Novafil sutures to reinforce the fascial closure.  I did not feel mesh was indicated today at the umbilical fascia.  3 interrupted Novafil sutures were placed.  The closure was reinspected.  There is nothing trapped within the closure.  Additional local was infiltrated around the umbilical fascia.  I had previously placed local along bilateral lateral abdominal walls as a tap block.  There was no  evidence of injury to surrounding structures. Pneumoperitoneum was  released, and the remaining trocars were removed. The umbilical stalk was tacked down to the umbilical fascia with an interrupted 3-0 Vicryl suture.  Deep dermis around the umbilical incision was reapproximated with interrupted inverted 3-0 Vicryl sutures.  All skin incisions  were closed with a 4-0 Monocryl in a subcuticular fashion followed by  application of benzoin, Steri-Strips and sterile bandages. All needle, instrument, and sponge counts  were correct x2. There are no immediate complications. The patient  tolerated the procedure well. The patient was extubated and taken to the  recovery room in stable condition.  Randall Hiss  Ronnie Derby, MD, FACS General, Bariatric, & Minimally Invasive Surgery [facility], [place]

## 2018-03-26 NOTE — Interval H&P Note (Signed)
History and Physical Interval Note:  03/26/2018 11:59 AM  Stanley Chapman  has presented today for surgery, with the diagnosis of left inguinal hernia and umbilical hernia  The various methods of treatment have been discussed with the patient and family. After consideration of risks, benefits and other options for treatment, the patient has consented to  Procedure(s): LAPAROSCOPIC LEFT  INGUINAL HERNIA WITH MESH AND  LAPAROSCOPIC ASSISTED REPAIR OF UMBILICAL HERNIA WITH MESH (Left) as a surgical intervention .  The patient's history has been reviewed, patient examined, no change in status, stable for surgery.  I have reviewed the patient's chart and labs.  Questions were answered to the patient's satisfaction.    Leighton Ruff. Redmond Pulling, MD, FACS General, Bariatric, & Minimally Invasive Surgery [facility] [place]   [name]

## 2018-03-26 NOTE — Anesthesia Procedure Notes (Signed)
Procedure Name: Intubation Date/Time: 03/26/2018 12:11 PM Performed by: Wanita Chamberlain, CRNA Pre-anesthesia Checklist: Timeout performed, Patient identified, Suction available, Patient being monitored and Emergency Drugs available Patient Re-evaluated:Patient Re-evaluated prior to induction Oxygen Delivery Method: Circle system utilized Preoxygenation: Pre-oxygenation with 100% oxygen Induction Type: IV induction Ventilation: Mask ventilation without difficulty Laryngoscope Size: Mac and 4 Grade View: Grade I Tube type: Oral Tube size: 7.5 mm Number of attempts: 1 Airway Equipment and Method: Stylet Placement Confirmation: breath sounds checked- equal and bilateral,  CO2 detector and positive ETCO2 Secured at: 22 cm Dental Injury: Teeth and Oropharynx as per pre-operative assessment

## 2018-03-27 ENCOUNTER — Encounter (HOSPITAL_BASED_OUTPATIENT_CLINIC_OR_DEPARTMENT_OTHER): Payer: Self-pay | Admitting: General Surgery

## 2018-03-29 ENCOUNTER — Encounter (HOSPITAL_BASED_OUTPATIENT_CLINIC_OR_DEPARTMENT_OTHER): Payer: Self-pay | Admitting: General Surgery

## 2018-04-11 ENCOUNTER — Other Ambulatory Visit: Payer: Self-pay | Admitting: Internal Medicine

## 2018-04-11 MED FILL — DOXAZOSIN MESYLATE 2 MG TAB: 2 | 30 days supply | Qty: 15 | Fill #7

## 2018-04-12 ENCOUNTER — Encounter (HOSPITAL_BASED_OUTPATIENT_CLINIC_OR_DEPARTMENT_OTHER): Payer: Self-pay | Admitting: General Surgery

## 2018-04-18 ENCOUNTER — Other Ambulatory Visit: Payer: Self-pay | Admitting: Internal Medicine

## 2018-04-20 MED FILL — metFORMIN HCL 1000 MG TABS: 1000 | 30 days supply | Qty: 60 | Fill #0

## 2018-06-07 ENCOUNTER — Other Ambulatory Visit: Payer: Self-pay

## 2018-06-07 ENCOUNTER — Ambulatory Visit: Payer: Self-pay | Attending: Internal Medicine | Admitting: Internal Medicine

## 2018-09-09 ENCOUNTER — Ambulatory Visit (HOSPITAL_COMMUNITY)
Admission: EM | Admit: 2018-09-09 | Discharge: 2018-09-09 | Disposition: A | Discharge status: Home / Self Care | Payer: 59 | Attending: Family Medicine | Admitting: Family Medicine

## 2018-09-09 ENCOUNTER — Encounter (HOSPITAL_COMMUNITY): Payer: Self-pay

## 2018-09-09 ENCOUNTER — Other Ambulatory Visit: Payer: Self-pay

## 2018-09-09 DIAGNOSIS — N401 Enlarged prostate with lower urinary tract symptoms: Secondary | ICD-10-CM | POA: Diagnosis not present

## 2018-09-09 DIAGNOSIS — E1110 Type 2 diabetes mellitus with ketoacidosis without coma: Secondary | ICD-10-CM | POA: Insufficient documentation

## 2018-09-09 DIAGNOSIS — I10 Essential (primary) hypertension: Secondary | ICD-10-CM | POA: Insufficient documentation

## 2018-09-09 LAB — COMPREHENSIVE METABOLIC PANEL
ALT: 28 U/L (ref 0–44)
AST: 24 U/L (ref 15–41)
Albumin: 3.9 g/dL (ref 3.5–5.0)
Alkaline Phosphatase: 84 U/L (ref 38–126)
Anion gap: 8 (ref 5–15)
BUN: 17 mg/dL (ref 6–20)
CO2: 24 mmol/L (ref 22–32)
Calcium: 9.5 mg/dL (ref 8.9–10.3)
Chloride: 104 mmol/L (ref 98–111)
Creatinine, Ser: 1.35 mg/dL — ABNORMAL HIGH (ref 0.61–1.24)
GFR calc Af Amer: >60 mL/min (ref >60)
GFR calc non Af Amer: 60 mL/min — ABNORMAL LOW (ref >60)
Glucose, Bld: 189 mg/dL — ABNORMAL HIGH (ref 70–99)
Potassium: 4 mmol/L (ref 3.5–5.1)
Sodium: 136 mmol/L (ref 135–145)
Total Bilirubin: 0.5 mg/dL (ref 0.3–1.2)
Total Protein: 7.3 g/dL (ref 6.5–8.1)

## 2018-09-09 LAB — HEMOGLOBIN A1C
Hgb A1c MFr Bld: 6.8 % — ABNORMAL HIGH (ref 4.8–5.6)
Mean Plasma Glucose: 148.46 mg/dL

## 2018-09-09 MED ORDER — HYDROCHLOROTHIAZIDE 25 MG PO TABS: 25.0000 mg | ORAL_TABLET | Freq: Every day | ORAL | 3 refills | Status: DC

## 2018-09-09 MED ORDER — TRUEPLUS LANCETS 26G MISC: 1.0000 | Freq: Three times a day (TID) | 12 refills | Status: DC | PRN

## 2018-09-09 MED ORDER — DOXAZOSIN MESYLATE 2 MG PO TABS: 2.0000 mg | ORAL_TABLET | Freq: Every day | ORAL | 3 refills | Status: DC

## 2018-09-09 MED ORDER — GLUCOSE BLOOD VI STRP: ORAL_STRIP | 12 refills | Status: AC

## 2018-09-09 MED ORDER — METFORMIN HCL 1000 MG PO TABS: 1000.0000 mg | ORAL_TABLET | Freq: Two times a day (BID) | ORAL | 3 refills | Status: DC

## 2018-09-09 NOTE — ED Triage Notes (Signed)
Pt presents to get refill on blood pressure medication, medicine for prostate, and diabetes medication.

## 2018-09-09 NOTE — ED Provider Notes (Signed)
[place]    CSN: [id number] Arrival date & time: 09/09/18  1349     History   Chief Complaint Chief Complaint  Patient presents with  . Medication Refill    HPI Cortavious KHIEM GARGIS is a 53 y.o. male.   53 yo established [place] patient presents for refill on his diabetes and hypertension medications.  Has had type II DM for several years.  No new problems.  Keeping sugar under 150 when he checks it.  Neg ROS     Past Medical History:  Diagnosis Date  . 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 Constipation   . Diabetes mellitus without complication ([place])    TYPE 2  . GERD (gastroesophageal reflux disease)   . Hernia, umbilical    INGUINAL  . Hypertension   . Melanosis coli   . Renal lesion     Patient Active Problem List   Diagnosis Date Noted  . Benign prostatic hyperplasia with lower urinary tract symptoms 07/14/2017  . 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 DM (diabetes mellitus) ([place]) 05/14/2013  . HTN (hypertension) 05/14/2013  . Hyperlipidemia 05/14/2013  . Special screening for malignant neoplasms, colon 05/08/2013    Past Surgical History:  Procedure Laterality Date  . INGUINAL HERNIA REPAIR  AGE 75  . LAPAROSCOPIC INGUINAL HERNIA WITH UMBILICAL HERNIA Left 8/82/8003   Procedure: LAPAROSCOPIC LEFT  INGUINAL HERNIA WITH MESH AND  LAPAROSCOPIC ASSISTED REPAIR OF UMBILICAL HERNIA;  Surgeon: [name], MD;  [facility];  Service: General;  Laterality: Left;       Home Medications    Prior to Admission medications   Medication Sig Start Date End Date Taking? Authorizing Provider  Blood Glucose Monitoring Suppl (TRUE METRIX GO GLUCOSE METER) w/Device KIT 1 each by Does not apply route every 8 (eight) hours as needed. 02/17/16   Maren Reamer, MD  doxazosin (CARDURA) 2 MG tablet Take 1 tablet (2 mg total) by mouth daily. 09/09/18    Robyn Haber, MD  esomeprazole (NEXIUM) 20 MG capsule Take 20 mg by mouth daily at 12 noon.    [provider]  glucose blood test strip Use as instructed 09/09/18   Robyn Haber, MD  hydrochlorothiazide (HYDRODIURIL) 25 MG tablet Take 1 tablet (25 mg total) by mouth daily. 09/09/18   Robyn Haber, MD  metFORMIN (GLUCOPHAGE) 1000 MG tablet Take 1 tablet (1,000 mg total) by mouth 2 (two) times daily. 09/09/18   Robyn Haber, MD  TRUEplus Lancets 26G MISC 1 each by Does not apply route every 8 (eight) hours as needed. 09/09/18   Robyn Haber, MD  pravastatin (PRAVACHOL) 20 MG tablet Take 1 tablet (20 mg total) by mouth daily. Patient not taking: Reported on 02/22/2018 07/14/17 09/09/18  Ladell Pier, MD    Family History Family History  Problem Relation Age of Onset  . Hypertension Mother   . Diabetes Mother   . Cancer Mother        Not sure of type    Social History Social History   Tobacco Use  . Smoking status: Never Smoker  . Smokeless tobacco: Never Used  Substance Use Topics  . Alcohol use: No  . Drug use: No    Comment: former marijuana use - x 20 years ago     Allergies   Patient has no known allergies.   Review of Systems Review of  Systems  Constitutional: Negative.   HENT: Negative.   Eyes: Negative.   Respiratory: Negative.   Cardiovascular: Negative.   Gastrointestinal: Negative.   Genitourinary: Negative.   Musculoskeletal: Negative.   Neurological: Negative.   All other systems reviewed and are negative.    Physical Exam Triage Vital Signs ED Triage Vitals [09/09/18 1431]  Enc Vitals Group     BP (!) 138/93     Pulse Rate 91     Resp 18     Temp 97.9 F (36.6 C)     Temp Source Oral     SpO2 98 %     Weight      Height      Head Circumference      Peak Flow      Pain Score 0     Pain Loc      Pain Edu?      Excl. in [place]?    No data found.  Updated Vital Signs BP (!) 138/93 (BP Location: Left Arm)   Pulse 91    Temp 97.9 F (36.6 C) (Oral)   Resp 18   SpO2 98%    Physical Exam Vitals signs and nursing note reviewed.  Constitutional:      Appearance: Normal appearance.  HENT:     Head: Normocephalic.     Mouth/Throat:     Mouth: Mucous membranes are moist.  Eyes:     Conjunctiva/sclera: Conjunctivae normal.  Neck:     Musculoskeletal: Normal range of motion and neck supple.  Cardiovascular:     Rate and Rhythm: Normal rate and regular rhythm.  Pulmonary:     Effort: Pulmonary effort is normal.     Breath sounds: Normal breath sounds.  Abdominal:     General: Abdomen is flat.     Tenderness: There is no abdominal tenderness.  Musculoskeletal: Normal range of motion.  Skin:    General: Skin is warm and dry.  Neurological:     General: No focal deficit present.     Mental Status: He is alert and oriented to person, place, and time.     Cranial Nerves: No cranial nerve deficit.     Sensory: No sensory deficit.  Psychiatric:        Mood and Affect: Mood normal.        Behavior: Behavior normal.        Thought Content: Thought content normal.        Judgment: Judgment normal.      UC Treatments / Results  Labs (all labs ordered are listed, but only abnormal results are displayed) Labs Reviewed  COMPREHENSIVE METABOLIC PANEL  HEMOGLOBIN A1C    EKG   Radiology No results found.  Procedures Procedures (including critical care time)  Medications Ordered in UC Medications - No data to display  Initial Impression / Assessment and Plan / UC Course  I have reviewed the triage vital signs and the nursing notes.  Pertinent labs & imaging results that were available during my care of the patient were reviewed by me and considered in my medical decision making (see chart for details).    Final Clinical Impressions(s) / UC Diagnoses   Final diagnoses:  DM (diabetes mellitus) type 2, uncontrolled, with ketoacidosis ([place])  Essential hypertension   Discharge Instructions    None    ED Prescriptions    Medication Sig Dispense Auth. Provider   doxazosin (CARDURA) 2 MG tablet Take 1 tablet (2 mg total) by mouth daily. [place]  tablet Robyn Haber, MD   hydrochlorothiazide (HYDRODIURIL) 25 MG tablet  (Status: Discontinued) Take 1 tablet (25 mg total) by mouth daily. 90 tablet Robyn Haber, MD   metFORMIN (GLUCOPHAGE) 1000 MG tablet Take 1 tablet (1,000 mg total) by mouth 2 (two) times daily. 180 tablet Robyn Haber, MD   glucose blood test strip Use as instructed 100 each Robyn Haber, MD   TRUEplus Lancets 26G MISC 1 each by Does not apply route every 8 (eight) hours as needed. 100 each Robyn Haber, MD   hydrochlorothiazide (HYDRODIURIL) 25 MG tablet Take 1 tablet (25 mg total) by mouth daily. 90 tablet Robyn Haber, MD     Controlled Substance Prescriptions [place] Controlled Substance Registry consulted? Not Applicable   Robyn Haber, MD 09/09/18 [phone]

## 2018-10-24 ENCOUNTER — Emergency Department (HOSPITAL_COMMUNITY)
Admission: EM | Admit: 2018-10-24 | Discharge: 2018-10-24 | Discharge status: Against Medical Advice | Payer: 59 | Attending: Emergency Medicine | Admitting: Emergency Medicine

## 2018-10-24 ENCOUNTER — Encounter (HOSPITAL_COMMUNITY): Payer: Self-pay

## 2018-10-24 ENCOUNTER — Other Ambulatory Visit: Payer: Self-pay

## 2018-10-24 DIAGNOSIS — Z5321 Procedure and treatment not carried out due to patient leaving prior to being seen by health care provider: Secondary | ICD-10-CM | POA: Insufficient documentation

## 2018-10-24 DIAGNOSIS — R51 Headache: Secondary | ICD-10-CM | POA: Diagnosis present

## 2018-10-24 NOTE — ED Triage Notes (Addendum)
Pt presents with a headache associated with "bells palsy" episode x4 days. Pt reports a hx of the same 10 years ago. Pt also reports headache x2 weeks, taking BC powders w/no relief

## 2018-10-29 ENCOUNTER — Other Ambulatory Visit: Payer: Self-pay

## 2018-10-29 ENCOUNTER — Encounter (HOSPITAL_COMMUNITY): Payer: Self-pay

## 2018-10-29 ENCOUNTER — Ambulatory Visit (HOSPITAL_COMMUNITY)
Admission: EM | Admit: 2018-10-29 | Discharge: 2018-10-29 | Disposition: A | Discharge status: Home / Self Care | Payer: 59 | Attending: Emergency Medicine | Admitting: Emergency Medicine

## 2018-10-29 DIAGNOSIS — G510 Bell's palsy: Secondary | ICD-10-CM | POA: Diagnosis not present

## 2018-10-29 MED ORDER — HYPROMELLOSE (GONIOSCOPIC) 2.5 % OP SOLN: 1.0000 [drp] | Freq: Four times a day (QID) | OPHTHALMIC | 12 refills | Status: DC

## 2018-10-29 MED ORDER — METHYLPREDNISOLONE ACETATE 80 MG/ML IJ SUSP
80.0000 mg | Freq: Once | INTRAMUSCULAR | Status: AC
  Administered 2018-10-29: 09:00:00 80 mg via INTRAMUSCULAR

## 2018-10-29 MED ORDER — PREDNISONE 10 MG PO TABS: 50.0000 mg | ORAL_TABLET | Freq: Every day | ORAL | 0 refills | Status: AC

## 2018-10-29 MED ORDER — METHYLPREDNISOLONE ACETATE 80 MG/ML IJ SUSP
INTRAMUSCULAR | Status: AC
  Filled 2018-10-29: qty 1

## 2018-10-29 NOTE — Discharge Instructions (Addendum)
You were given a shot of a steroid called Depo-Medrol today.  Start taking the prednisone as directed tomorrow.    Use the artificial tears in your right eye as directed.  Gently apply gauze to your eye at night to keep it closed.  Follow-up with your primary care provider or the provider listed below in the next 2 to 3 days.  Your blood pressure is elevated today at 158/106.  Please have this rechecked by your primary care provider in 2 weeks.  If you do not have a primary care provider, one is suggested below.

## 2018-10-29 NOTE — ED Provider Notes (Signed)
Stanley Chapman    CSN: [id number] Arrival date & time: 10/29/18  0801      History   Chief Complaint Chief Complaint  Patient presents with  . Bells Palsy    HPI Stanley Chapman is a 53 y.o. male.   Patient presents with right side facial weakness x 5 days.  He states these symptoms are like his previous episode of Bell's palsy 10 years ago.  He denies weakness or numbness in his arms, legs, or trunk.  He denies difficulty swallowing, difficulty breathing, chest pain, shortness of breath, or other symptoms.  The history is provided by the patient.    Past Medical History:  Diagnosis Date  . 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 Constipation   . Diabetes mellitus without complication ([place])    TYPE 2  . GERD (gastroesophageal reflux disease)   . Hernia, umbilical    INGUINAL  . Hypertension   . Melanosis coli   . Renal lesion     Patient Active Problem List   Diagnosis Date Noted  . Benign prostatic hyperplasia with lower urinary tract symptoms 07/14/2017  . 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 DM (diabetes mellitus) ([place]) 05/14/2013  . HTN (hypertension) 05/14/2013  . Hyperlipidemia 05/14/2013  . Special screening for malignant neoplasms, colon 05/08/2013    Past Surgical History:  Procedure Laterality Date  . INGUINAL HERNIA REPAIR  AGE 72  . LAPAROSCOPIC INGUINAL HERNIA WITH UMBILICAL HERNIA Left 9/92/4268   Procedure: LAPAROSCOPIC LEFT  INGUINAL HERNIA WITH MESH AND  LAPAROSCOPIC ASSISTED REPAIR OF UMBILICAL HERNIA;  Surgeon: [name], MD;  Location: [facility];  Service: General;  Laterality: Left;       Home Medications    Prior to Admission medications   Medication Sig Start Date End Date Taking? Authorizing Provider  Blood Glucose Monitoring Suppl (TRUE METRIX GO GLUCOSE METER) w/Device KIT 1 each by Does not apply route every 8 (eight) hours  as needed. 02/17/16   Maren Reamer, MD  doxazosin (CARDURA) 2 MG tablet Take 1 tablet (2 mg total) by mouth daily. 09/09/18   Robyn Haber, MD  esomeprazole (NEXIUM) 20 MG capsule Take 20 mg by mouth daily at 12 noon.    [provider]  glucose blood test strip Use as instructed 09/09/18   Robyn Haber, MD  hydrochlorothiazide (HYDRODIURIL) 25 MG tablet Take 1 tablet (25 mg total) by mouth daily. 09/09/18   Robyn Haber, MD  hydroxypropyl methylcellulose / hypromellose (ISOPTO TEARS / GONIOVISC) 2.5 % ophthalmic solution Place 1 drop into the right eye 4 (four) times daily. 10/29/18   Sharion Balloon, NP  metFORMIN (GLUCOPHAGE) 1000 MG tablet Take 1 tablet (1,000 mg total) by mouth 2 (two) times daily. 09/09/18   Robyn Haber, MD  predniSONE (DELTASONE) 10 MG tablet Take 5 tablets (50 mg total) by mouth daily for 5 days. 10/29/18 11/03/18  Sharion Balloon, NP  TRUEplus Lancets 26G MISC 1 each by Does not apply route every 8 (eight) hours as needed. 09/09/18   Robyn Haber, MD  pravastatin (PRAVACHOL) 20 MG tablet Take 1 tablet (20 mg total) by mouth daily. Patient not taking: Reported on 02/22/2018 07/14/17 09/09/18  Ladell Pier, MD    Family History Family History  Problem Relation Age of Onset  . Hypertension Mother   . Diabetes Mother   . Cancer Mother  Not sure of type    Social History Social History   Tobacco Use  . Smoking status: Never Smoker  . Smokeless tobacco: Never Used  Substance Use Topics  . Alcohol use: No  . Drug use: No    Comment: former marijuana use - x 20 years ago     Allergies   Patient has no known allergies.   Review of Systems Review of Systems  Constitutional: Negative for chills and fever.  HENT: Negative for ear pain and sore throat.   Eyes: Negative for pain and visual disturbance.  Respiratory: Negative for cough and shortness of breath.   Cardiovascular: Negative for chest pain and palpitations.   Gastrointestinal: Negative for abdominal pain and vomiting.  Genitourinary: Negative for dysuria and hematuria.  Musculoskeletal: Negative for arthralgias and back pain.  Skin: Negative for color change and rash.  Neurological: Positive for facial asymmetry, speech difficulty, weakness and headaches. Negative for seizures and syncope.  All other systems reviewed and are negative.    Physical Exam Triage Vital Signs ED Triage Vitals  Enc Vitals Group     BP      Pulse      Resp      Temp      Temp src      SpO2      Weight      Height      Head Circumference      Peak Flow      Pain Score      Pain Loc      Pain Edu?      Excl. in [place]?    No data found.  Updated Vital Signs BP (!) 158/106 (BP Location: Left Arm)   Pulse 77   Temp 98.6 F (37 C) (Oral)   Resp 17   SpO2 96%   Visual Acuity Right Eye Distance:   Left Eye Distance:   Bilateral Distance:    Right Eye Near:   Left Eye Near:    Bilateral Near:     Physical Exam Vitals signs and nursing note reviewed.  Constitutional:      Appearance: He is well-developed.  HENT:     Head: Normocephalic and atraumatic.     Right Ear: Tympanic membrane normal.     Left Ear: Tympanic membrane normal.     Nose: Nose normal.     Mouth/Throat:     Mouth: Mucous membranes are moist.     Pharynx: Oropharynx is clear.  Eyes:     Conjunctiva/sclera: Conjunctivae normal.  Neck:     Musculoskeletal: Neck supple.  Cardiovascular:     Rate and Rhythm: Normal rate and regular rhythm.     Heart sounds: No murmur.  Pulmonary:     Effort: Pulmonary effort is normal. No respiratory distress.     Breath sounds: Normal breath sounds.  Abdominal:     General: Bowel sounds are normal.     Palpations: Abdomen is soft.     Tenderness: There is no abdominal tenderness. There is no guarding or rebound.  Skin:    General: Skin is warm and dry.  Neurological:     Mental Status: He is alert and oriented to person, place, and  time.     Cranial Nerves: Cranial nerve deficit present.     Gait: Gait normal.     Comments: Right side facial weakness.  Extremities strength 5/5 bilateral.  Sensation intact.  2+ pulses.   Psychiatric:  Mood and Affect: Mood normal.        Behavior: Behavior normal.      UC Treatments / Results  Labs (all labs ordered are listed, but only abnormal results are displayed) Labs Reviewed - No data to display  EKG   Radiology No results found.  Procedures Procedures (including critical care time)  Medications Ordered in UC Medications  methylPREDNISolone acetate (DEPO-MEDROL) injection 80 mg (has no administration in time range)  methylPREDNISolone acetate (DEPO-MEDROL) 80 MG/ML injection (has no administration in time range)    Initial Impression / Assessment and Plan / UC Course  I have reviewed the triage vital signs and the nursing notes.  Pertinent labs & imaging results that were available during my care of the patient were reviewed by me and considered in my medical decision making (see chart for details).    Bell's palsy.  Discussed with Dr. Joseph Art.  Depo-Medrol injection given here today and starting patient on prednisone tomorrow.  Artificial tears prescribed for right eye and patient instructed to gently close his eye during hours of sleep.  Instructed patient to follow-up with his PCP or the suggested PCP in the next 2 to 3 days for a recheck.  Instructed patient that his blood pressure is elevated today and he needs to have this followed up with his PCP also.  Instructed patient to go to the emergency department if he develops difficulty swallowing or breathing.  Patient agrees to plan of care.     Final Clinical Impressions(s) / UC Diagnoses   Final diagnoses:  Bell's palsy     Discharge Instructions     You were given a shot of a steroid called Depo-Medrol today.  Start taking the prednisone as directed tomorrow.    Use the artificial tears  in your right eye as directed.  Gently apply gauze to your eye at night to keep it closed.  Follow-up with your primary care provider or the provider listed below in the next 2 to 3 days.  Your blood pressure is elevated today at 158/106.  Please have this rechecked by your primary care provider in 2 weeks.  If you do not have a primary care provider, one is suggested below.         ED Prescriptions    Medication Sig Dispense Auth. Provider   predniSONE (DELTASONE) 10 MG tablet Take 5 tablets (50 mg total) by mouth daily for 5 days. 25 tablet Sharion Balloon, NP   hydroxypropyl methylcellulose / hypromellose (ISOPTO TEARS / GONIOVISC) 2.5 % ophthalmic solution Place 1 drop into the right eye 4 (four) times daily. 15 mL Sharion Balloon, NP     I have reviewed the PDMP during this encounter.   Sharion Balloon, NP 10/29/18 [phone]

## 2018-10-29 NOTE — ED Triage Notes (Signed)
Pt presents with ongoing headache and discomfort with facial muscles associated with his bells palsy.  Pt states he has not had difficulties in about 10 years and OTC medications have not helped.

## 2019-08-21 ENCOUNTER — Ambulatory Visit (HOSPITAL_COMMUNITY)
Admission: EM | Admit: 2019-08-21 | Discharge: 2019-08-21 | Disposition: A | Discharge status: Home / Self Care | Payer: 59 | Attending: Emergency Medicine | Admitting: Emergency Medicine

## 2019-08-21 DIAGNOSIS — N400 Enlarged prostate without lower urinary tract symptoms: Secondary | ICD-10-CM | POA: Insufficient documentation

## 2019-08-21 DIAGNOSIS — Z760 Encounter for issue of repeat prescription: Secondary | ICD-10-CM | POA: Insufficient documentation

## 2019-08-21 DIAGNOSIS — I10 Essential (primary) hypertension: Secondary | ICD-10-CM | POA: Insufficient documentation

## 2019-08-21 DIAGNOSIS — E119 Type 2 diabetes mellitus without complications: Secondary | ICD-10-CM | POA: Insufficient documentation

## 2019-08-21 LAB — COMPREHENSIVE METABOLIC PANEL
ALT: 26 U/L (ref 0–44)
AST: 27 U/L (ref 15–41)
Albumin: 3.8 g/dL (ref 3.5–5.0)
Alkaline Phosphatase: 64 U/L (ref 38–126)
Anion gap: 12 (ref 5–15)
BUN: 13 mg/dL (ref 6–20)
CO2: 26 mmol/L (ref 22–32)
Calcium: 9.1 mg/dL (ref 8.9–10.3)
Chloride: 99 mmol/L (ref 98–111)
Creatinine, Ser: 1.51 mg/dL — ABNORMAL HIGH (ref 0.61–1.24)
GFR calc Af Amer: >60 mL/min (ref >60)
GFR calc non Af Amer: 52 mL/min — ABNORMAL LOW (ref >60)
Glucose, Bld: 124 mg/dL — ABNORMAL HIGH (ref 70–99)
Potassium: 3.5 mmol/L (ref 3.5–5.1)
Sodium: 137 mmol/L (ref 135–145)
Total Bilirubin: 0.4 mg/dL (ref 0.3–1.2)
Total Protein: 7.1 g/dL (ref 6.5–8.1)

## 2019-08-21 LAB — HEMOGLOBIN A1C
Hgb A1c MFr Bld: 7.1 % — ABNORMAL HIGH (ref 4.8–5.6)
Mean Plasma Glucose: 157.07 mg/dL

## 2019-08-21 MED ORDER — DOXAZOSIN MESYLATE 2 MG PO TABS: 2.0000 mg | ORAL_TABLET | Freq: Every day | ORAL | 0 refills | Status: DC

## 2019-08-21 MED ORDER — HYDROCHLOROTHIAZIDE 25 MG PO TABS: 25.0000 mg | ORAL_TABLET | Freq: Every day | ORAL | 0 refills | Status: DC

## 2019-08-21 MED ORDER — METFORMIN HCL 1000 MG PO TABS: 1000.0000 mg | ORAL_TABLET | Freq: Two times a day (BID) | ORAL | 0 refills | Status: DC

## 2019-08-21 NOTE — ED Triage Notes (Signed)
Pt requesting refill of metformin, doxazosin, HCTZ

## 2019-08-21 NOTE — Discharge Instructions (Signed)
I have refilled your medicines Please follow-up with primary care for further refills I will call with blood work abnormal and needing to change from current meds Follow-up for any concerns

## 2019-08-22 NOTE — ED Provider Notes (Signed)
[place]    CSN: [id number] Arrival date & time: 08/21/19  1815      History   Chief Complaint Chief Complaint  Patient presents with  . Medication Refill    HPI Stanley Chapman is a 54 y.o. male history of hypertension, BPH and DM type II presenting today for medication refill.  Patient is requesting refills of Metformin, doxazosin and HCTZ.  He recently ran out.  He denies any problems with his medicines, denies any symptoms.  Denies headaches, vision changes, chest pain or shortness of breath.  Denies lightheadedness or dizziness.  [facility], but expresses concern with difficulty being seen.  Hoping to get set up with new PCP.  HPI  Past Medical History:  Diagnosis Date  . 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 Constipation   . Diabetes mellitus without complication ([place])    TYPE 2  . GERD (gastroesophageal reflux disease)   . Hernia, umbilical    INGUINAL  . Hypertension   . Melanosis coli   . Renal lesion     Patient Active Problem List   Diagnosis Date Noted  . Benign prostatic hyperplasia with lower urinary tract symptoms 07/14/2017  . 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 DM (diabetes mellitus) ([place]) 05/14/2013  . HTN (hypertension) 05/14/2013  . Hyperlipidemia 05/14/2013  . Special screening for malignant neoplasms, colon 05/08/2013    Past Surgical History:  Procedure Laterality Date  . INGUINAL HERNIA REPAIR  AGE 74  . LAPAROSCOPIC INGUINAL HERNIA WITH UMBILICAL HERNIA Left 06/17/209   Procedure: LAPAROSCOPIC LEFT  INGUINAL HERNIA WITH MESH AND  LAPAROSCOPIC ASSISTED REPAIR OF UMBILICAL HERNIA;  Surgeon: [name], MD;  Location: [facility];  Service: General;  Laterality: Left;       Home Medications    Prior to Admission medications   Medication Sig Start Date End Date Taking? Authorizing Provider    Blood Glucose Monitoring Suppl (TRUE METRIX GO GLUCOSE METER) w/Device KIT 1 each by Does not apply route every 8 (eight) hours as needed. 02/17/16   Maren Reamer, MD  doxazosin (CARDURA) 2 MG tablet Take 1 tablet (2 mg total) by mouth daily. 08/21/19   Estee Yohe C, PA-C  esomeprazole (NEXIUM) 20 MG capsule Take 20 mg by mouth daily at 12 noon.    [provider]  glucose blood test strip Use as instructed 09/09/18   Robyn Haber, MD  hydrochlorothiazide (HYDRODIURIL) 25 MG tablet Take 1 tablet (25 mg total) by mouth daily. 08/21/19   Daquan Crapps C, PA-C  hydroxypropyl methylcellulose / hypromellose (ISOPTO TEARS / GONIOVISC) 2.5 % ophthalmic solution Place 1 drop into the right eye 4 (four) times daily. 10/29/18   Sharion Balloon, NP  metFORMIN (GLUCOPHAGE) 1000 MG tablet Take 1 tablet (1,000 mg total) by mouth 2 (two) times daily. 08/21/19   Moises Terpstra C, PA-C  TRUEplus Lancets 26G MISC 1 each by Does not apply route every 8 (eight) hours as needed. 09/09/18   Robyn Haber, MD  pravastatin (PRAVACHOL) 20 MG tablet Take 1 tablet (20 mg total) by mouth daily. Patient not taking: Reported on 02/22/2018 07/14/17 09/09/18  Ladell Pier, MD    Family History Family History  Problem Relation Age of Onset  . Hypertension Mother   . Diabetes Mother   . Cancer Mother  Not sure of type    Social History Social History   Tobacco Use  . Smoking status: Never Smoker  . Smokeless tobacco: Never Used  Vaping Use  . Vaping Use: Never used  Substance Use Topics  . Alcohol use: No  . Drug use: No    Comment: former marijuana use - x 20 years ago     Allergies   Patient has no known allergies.   Review of Systems Review of Systems  Constitutional: Negative for fatigue and fever.  HENT: Negative for congestion, sinus pressure and sore throat.   Eyes: Negative for photophobia, pain and visual disturbance.  Respiratory: Negative for cough and shortness of  breath.   Cardiovascular: Negative for chest pain.  Gastrointestinal: Negative for abdominal pain, nausea and vomiting.  Genitourinary: Negative for decreased urine volume and hematuria.  Musculoskeletal: Negative for myalgias, neck pain and neck stiffness.  Neurological: Negative for dizziness, syncope, facial asymmetry, speech difficulty, weakness, light-headedness, numbness and headaches.     Physical Exam Triage Vital Signs ED Triage Vitals  Enc Vitals Group     BP 08/21/19 1922 127/83     Pulse Rate 08/21/19 1922 83     Resp 08/21/19 1922 18     Temp 08/21/19 1922 98.2 F (36.8 C)     Temp src --      SpO2 08/21/19 1922 100 %     Weight --      Height --      Head Circumference --      Peak Flow --      Pain Score 08/21/19 2015 0     Pain Loc --      Pain Edu? --      Excl. in [place]? --    No data found.  Updated Vital Signs BP 127/83   Pulse 83   Temp 98.2 F (36.8 C)   Resp 18   SpO2 100%   Visual Acuity Right Eye Distance:   Left Eye Distance:   Bilateral Distance:    Right Eye Near:   Left Eye Near:    Bilateral Near:     Physical Exam Vitals and nursing note reviewed.  Constitutional:      Appearance: He is well-developed.     Comments: No acute distress  HENT:     Head: Normocephalic and atraumatic.     Nose: Nose normal.  Eyes:     Extraocular Movements: Extraocular movements intact.     Conjunctiva/sclera: Conjunctivae normal.     Pupils: Pupils are equal, round, and reactive to light.  Cardiovascular:     Rate and Rhythm: Normal rate and regular rhythm.  Pulmonary:     Effort: Pulmonary effort is normal. No respiratory distress.     Comments: Breathing comfortably at rest, CTABL, no wheezing, rales or other adventitious sounds auscultated  Abdominal:     General: There is no distension.  Musculoskeletal:        General: Normal range of motion.     Cervical back: Neck supple.  Skin:    General: Skin is warm and dry.  Neurological:      Mental Status: He is alert and oriented to person, place, and time.      UC Treatments / Results  Labs (all labs ordered are listed, but only abnormal results are displayed) Labs Reviewed  COMPREHENSIVE METABOLIC PANEL - Abnormal; Notable for the following components:      Result Value   Glucose, Bld 124 (*)  Creatinine, Ser 1.51 (*)    GFR calc non Af Amer 52 (*)    All other components within normal limits  HEMOGLOBIN A1C - Abnormal; Notable for the following components:   Hgb A1c MFr Bld 7.1 (*)    All other components within normal limits    EKG   Radiology No results found.  Procedures Procedures (including critical care time)  Medications Ordered in UC Medications - No data to display  Initial Impression / Assessment and Plan / UC Course  I have reviewed the triage vital signs and the nursing notes.  Pertinent labs & imaging results that were available during my care of the patient were reviewed by me and considered in my medical decision making (see chart for details).     We will refill medicines but checking CMP to check kidney function as well as A1c for monitoring as this past was done approximately 1 year ago.  Provided contact for PCP follow-up.  Creatinine did elevate from 1.35-1.51, estimated GFR still greater than 60 in [place].  We will proceed with immunologic continue on Metformin.  Follow-up with PCP for further refills and management of hypertension/diabetes/BPH. Discussed strict return precautions. Patient verbalized understanding and is agreeable with plan.   Final Clinical Impressions(s) / UC Diagnoses   Final diagnoses:  Essential hypertension  Type 2 diabetes mellitus without complication, without long-term current use of insulin (HCC)  Benign prostatic hyperplasia, unspecified whether lower urinary tract symptoms present  Medication refill     Discharge Instructions     I have refilled your medicines Please follow-up with primary  care for further refills I will call with blood work abnormal and needing to change from current meds Follow-up for any concerns   ED Prescriptions    Medication Sig Dispense Auth. Provider   doxazosin (CARDURA) 2 MG tablet Take 1 tablet (2 mg total) by mouth daily. 90 tablet Araly Kaas C, PA-C   hydrochlorothiazide (HYDRODIURIL) 25 MG tablet Take 1 tablet (25 mg total) by mouth daily. 90 tablet Tawana Pasch C, PA-C   metFORMIN (GLUCOPHAGE) 1000 MG tablet Take 1 tablet (1,000 mg total) by mouth 2 (two) times daily. 180 tablet Georgetta Crafton, [place] C, PA-C     PDMP not reviewed this encounter.   Janith Lima, [place] 08/22/19 [phone]

## 2019-09-25 ENCOUNTER — Encounter (HOSPITAL_COMMUNITY): Payer: Self-pay

## 2019-09-25 ENCOUNTER — Ambulatory Visit (HOSPITAL_COMMUNITY)
Admission: EM | Admit: 2019-09-25 | Discharge: 2019-09-25 | Disposition: A | Discharge status: Home / Self Care | Payer: Self-pay | Attending: Urgent Care | Admitting: Urgent Care

## 2019-09-25 ENCOUNTER — Other Ambulatory Visit: Payer: Self-pay

## 2019-09-25 DIAGNOSIS — N3000 Acute cystitis without hematuria: Secondary | ICD-10-CM | POA: Insufficient documentation

## 2019-09-25 DIAGNOSIS — R300 Dysuria: Secondary | ICD-10-CM | POA: Insufficient documentation

## 2019-09-25 DIAGNOSIS — E119 Type 2 diabetes mellitus without complications: Secondary | ICD-10-CM | POA: Insufficient documentation

## 2019-09-25 DIAGNOSIS — R350 Frequency of micturition: Secondary | ICD-10-CM | POA: Insufficient documentation

## 2019-09-25 DIAGNOSIS — N401 Enlarged prostate with lower urinary tract symptoms: Secondary | ICD-10-CM | POA: Insufficient documentation

## 2019-09-25 LAB — POCT URINALYSIS DIPSTICK, ED / UC
Bilirubin Urine: NEGATIVE
Glucose, UA: NEGATIVE mg/dL
Hgb urine dipstick: NEGATIVE
Ketones, ur: NEGATIVE mg/dL
Leukocytes,Ua: NEGATIVE
Nitrite: NEGATIVE
Protein, ur: NEGATIVE mg/dL
Specific Gravity, Urine: 1.025 (ref 1.005–1.030)
Urobilinogen, UA: 1 mg/dL (ref 0.0–1.0)
pH: 7 (ref 5.0–8.0)

## 2019-09-25 MED ORDER — TAMSULOSIN HCL 0.4 MG PO CAPS: 0.4000 mg | ORAL_CAPSULE | Freq: Every day | ORAL | 1 refills | Status: AC

## 2019-09-25 MED ORDER — TAMSULOSIN HCL 0.4 MG PO CAPS: 0.4000 mg | ORAL_CAPSULE | Freq: Every day | ORAL | 0 refills | Status: DC

## 2019-09-25 MED ORDER — CEPHALEXIN 500 MG PO CAPS: 500.0000 mg | ORAL_CAPSULE | Freq: Two times a day (BID) | ORAL | 0 refills | Status: DC

## 2019-09-25 NOTE — ED Provider Notes (Signed)
[name]   MRN: [id number] DOB: August 28, 1965  Subjective:   Stanley Chapman is a 54 y.o. male presenting for 2 day hx of acute onset dysuria, urinary frequency. Has had difficulty urinating but eventually empties his bladder. Has a hx of BPH. Has doxazosin, does not find it helpful. Has used Flomax in the past with better relief. Has not been to urology in years.   No current facility-administered medications for this encounter.  Current Outpatient Medications:  .  Blood Glucose Monitoring Suppl (TRUE METRIX GO GLUCOSE METER) w/Device KIT, 1 each by Does not apply route every 8 (eight) hours as needed., Disp: 1 kit, Rfl: 0 .  doxazosin (CARDURA) 2 MG tablet, Take 1 tablet (2 mg total) by mouth daily., Disp: 90 tablet, Rfl: 0 .  esomeprazole (NEXIUM) 20 MG capsule, Take 20 mg by mouth daily at 12 noon., Disp: , Rfl:  .  glucose blood test strip, Use as instructed, Disp: 100 each, Rfl: 12 .  hydrochlorothiazide (HYDRODIURIL) 25 MG tablet, Take 1 tablet (25 mg total) by mouth daily., Disp: 90 tablet, Rfl: 0 .  hydroxypropyl methylcellulose / hypromellose (ISOPTO TEARS / GONIOVISC) 2.5 % ophthalmic solution, Place 1 drop into the right eye 4 (four) times daily., Disp: 15 mL, Rfl: 12 .  metFORMIN (GLUCOPHAGE) 1000 MG tablet, Take 1 tablet (1,000 mg total) by mouth 2 (two) times daily., Disp: 180 tablet, Rfl: 0 .  TRUEplus Lancets 26G MISC, 1 each by Does not apply route every 8 (eight) hours as needed., Disp: 100 each, Rfl: 12   No Known Allergies  Past Medical History:  Diagnosis Date  . 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 Constipation   . Diabetes mellitus without complication ([place])    TYPE 2  . GERD (gastroesophageal reflux disease)   . Hernia, umbilical    INGUINAL  . Hypertension   . Melanosis coli   . Renal lesion      Past Surgical History:  Procedure Laterality Date  . INGUINAL HERNIA REPAIR  AGE 31  . LAPAROSCOPIC INGUINAL  HERNIA WITH UMBILICAL HERNIA Left 5/63/8756   Procedure: LAPAROSCOPIC LEFT  INGUINAL HERNIA WITH MESH AND  LAPAROSCOPIC ASSISTED REPAIR OF UMBILICAL HERNIA;  Surgeon: [name], MD;  Location: [facility];  Service: General;  Laterality: Left;    Family History  Problem Relation Age of Onset  . Hypertension Mother   . Diabetes Mother   . Cancer Mother        Not sure of type    Social History   Tobacco Use  . Smoking status: Never Smoker  . Smokeless tobacco: Never Used  Vaping Use  . Vaping Use: Never used  Substance Use Topics  . Alcohol use: No  . Drug use: No    Comment: former marijuana use - x 20 years ago    ROS   Objective:   Vitals: BP 132/80   Pulse 86   Temp 98.9 F (37.2 C) (Oral)   Resp 16   Ht '5\' 11"'  (1.803 m)   Wt 220 lb (99.8 kg)   SpO2 100%   BMI 30.68 kg/m   Physical Exam Constitutional:      General: He is not in acute distress.    Appearance: Normal appearance. He is well-developed. He is not ill-appearing, toxic-appearing or diaphoretic.  HENT:     Head: Normocephalic and atraumatic.     Right Ear: External ear  normal.     Left Ear: External ear normal.     Nose: Nose normal.     Mouth/Throat:     Mouth: Mucous membranes are moist.     Pharynx: Oropharynx is clear.  Eyes:     General: No scleral icterus.       Right eye: No discharge.        Left eye: No discharge.     Extraocular Movements: Extraocular movements intact.     Conjunctiva/sclera: Conjunctivae normal.     Pupils: Pupils are equal, round, and reactive to light.  Cardiovascular:     Rate and Rhythm: Normal rate and regular rhythm.     Heart sounds: Normal heart sounds. No murmur heard.  No friction rub. No gallop.   Pulmonary:     Effort: Pulmonary effort is normal. No respiratory distress.     Breath sounds: Normal breath sounds. No stridor. No wheezing, rhonchi or rales.  Abdominal:     General: Bowel sounds are normal. There is no distension.      Palpations: Abdomen is soft. There is no mass.     Tenderness: There is abdominal tenderness (slight over pelvic area). There is no right CVA tenderness, left CVA tenderness, guarding or rebound.  Skin:    General: Skin is warm and dry.  Neurological:     Mental Status: He is alert and oriented to person, place, and time.  Psychiatric:        Mood and Affect: Mood normal.        Behavior: Behavior normal.        Thought Content: Thought content normal.        Judgment: Judgment normal.     Results for orders placed or performed during the hospital encounter of 09/25/19 (from the past 24 hour(s))  POCT Urinalysis Dipstick (ED/UC)     Status: None   Collection Time: 09/25/19  7:56 PM  Result Value Ref Range   Glucose, UA NEGATIVE NEGATIVE mg/dL   Bilirubin Urine NEGATIVE NEGATIVE   Ketones, ur NEGATIVE NEGATIVE mg/dL   Specific Gravity, Urine 1.025 1.005 - 1.030   Hgb urine dipstick NEGATIVE NEGATIVE   pH 7.0 5.0 - 8.0   Protein, ur NEGATIVE NEGATIVE mg/dL   Urobilinogen, UA 1.0 0.0 - 1.0 mg/dL   Nitrite NEGATIVE NEGATIVE   Leukocytes,Ua NEGATIVE NEGATIVE    Assessment and Plan :   PDMP not reviewed this encounter.  1. Acute cystitis without hematuria   2. Dysuria   3. Urinary frequency   4. Well controlled diabetes mellitus ([place])   5. Benign prostatic hyperplasia with urinary frequency     Will cover for cystitis with Keflex, urine culture pending. Suspect this is secondary to BPH. Vital signs stable for outpatient management. I did discuss possibility of prostatitis but will hold off on empiric treatment for this. Start Flomax again. Follow up with urology. Counseled patient on potential for adverse effects with medications prescribed/recommended today, ER and return-to-clinic precautions discussed, patient verbalized understanding.     Jaynee Eagles, PA-C 09/25/19 2018

## 2019-09-25 NOTE — ED Triage Notes (Signed)
Pt c/o dysuria and difficulty w/urinationx2 days. Pt denies any other symptoms.

## 2019-09-27 LAB — URINE CULTURE: Culture: NO GROWTH

## 2019-11-02 IMAGING — CT CT HEAD W/O CM
4 series · 17 of 47 positions shown, 19 images · non-contrast
Comparison: None.

CLINICAL DATA: Headache for 2 weeks.

EXAM:
CT HEAD WITHOUT CONTRAST
TECHNIQUE: Contiguous axial images were obtained from the base of the skull
through the vertex without intravenous contrast.

[Series 3: head without · axial · non-contrast · 0.43mm/px · z∈[-99,+26]mm · 7 of 35 slices shown, 9 images]
[im 5/35  brain]
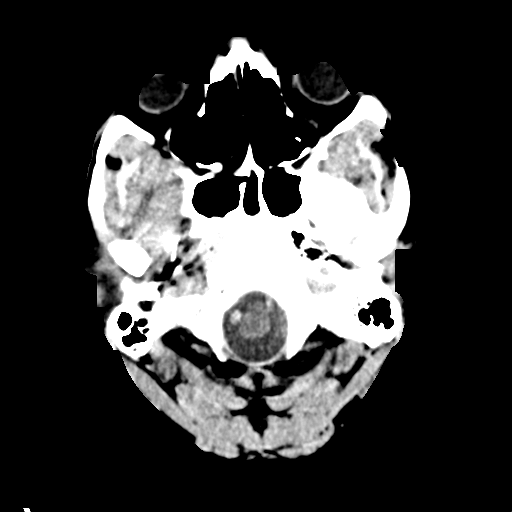
[im 5/35  bone]
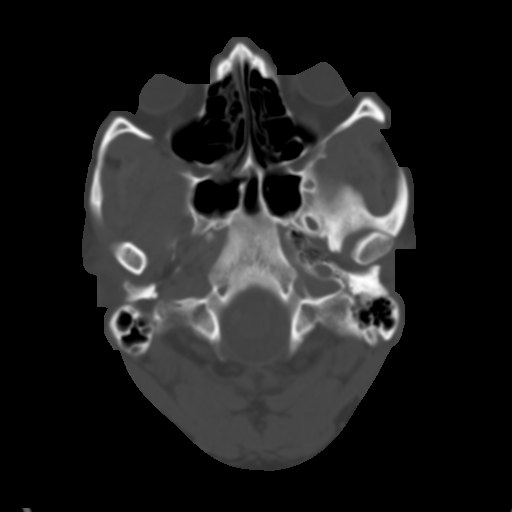
[im 9/35  brain]
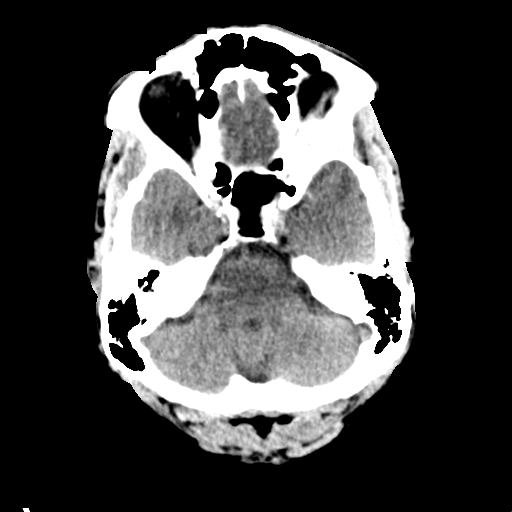
[im 13/35  brain]
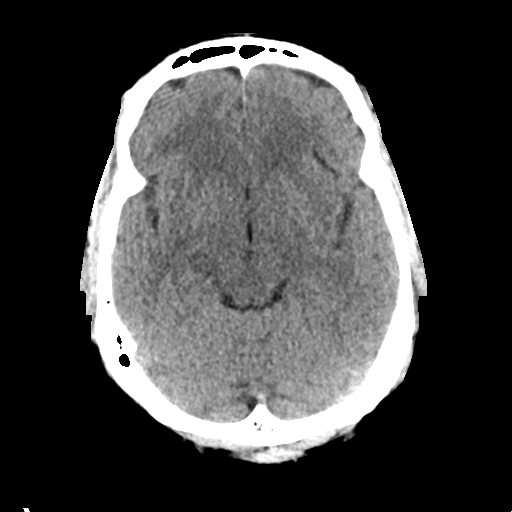
[im 18/35  brain]
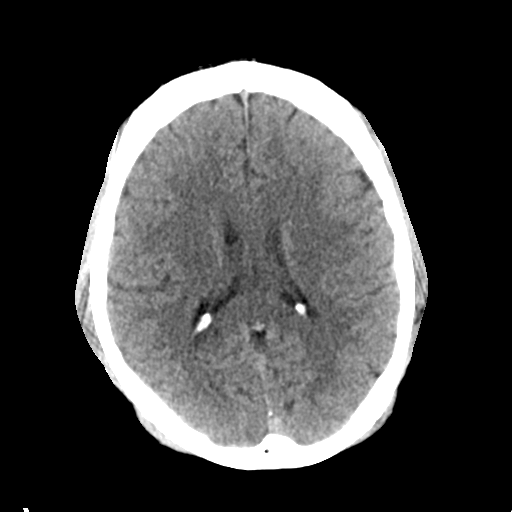
[im 22/35  brain]
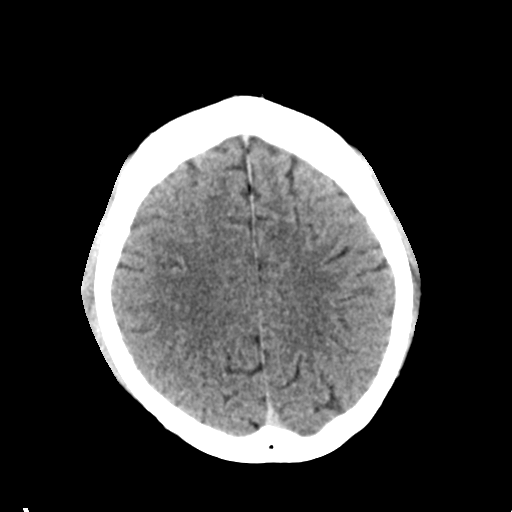
[im 22/35  bone]
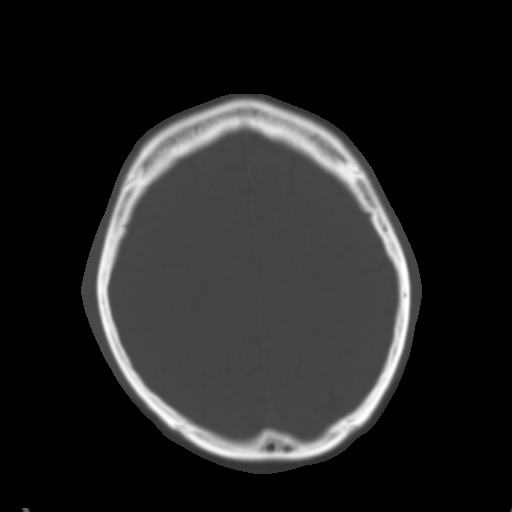
[im 26/35  brain]
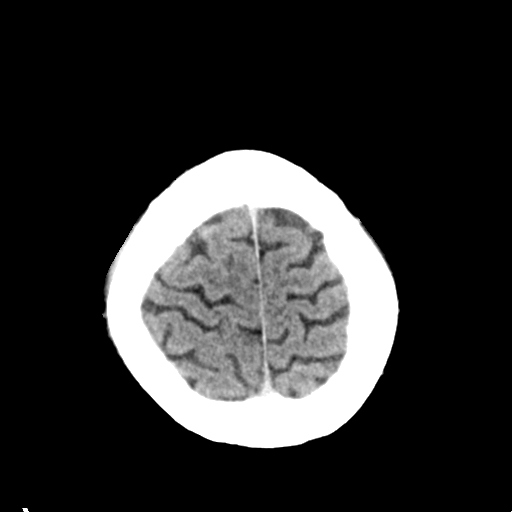
[im 30/35  brain]
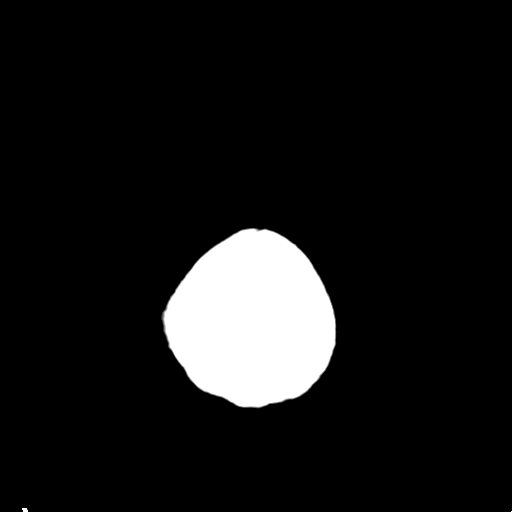

[Series 4: head bone · axial · 0.43mm/px · z∈[-103,-43]mm · 4 of 87 slices shown]
[im 9/87  bone]
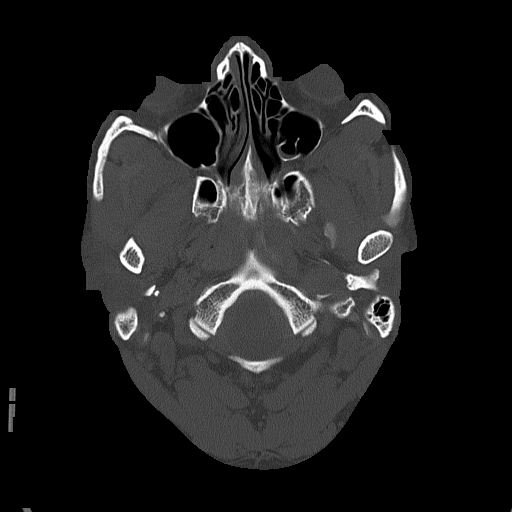
[im 18/87  bone]
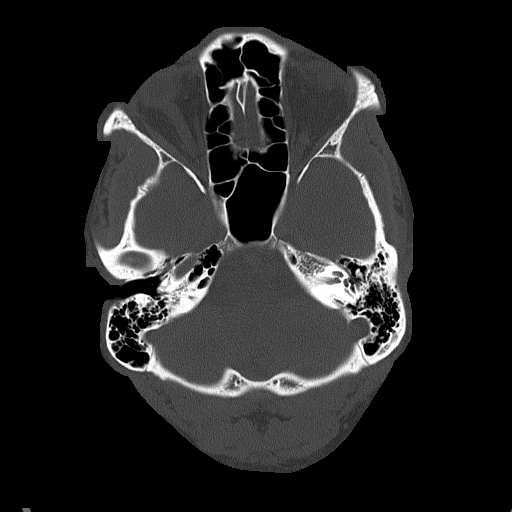
[im 26/87  bone]
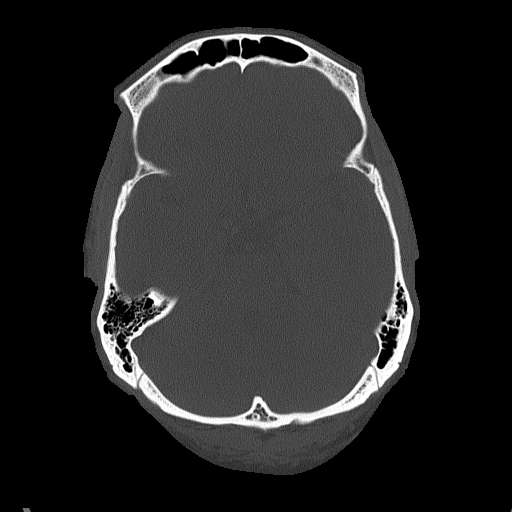
[im 39/87  bone]
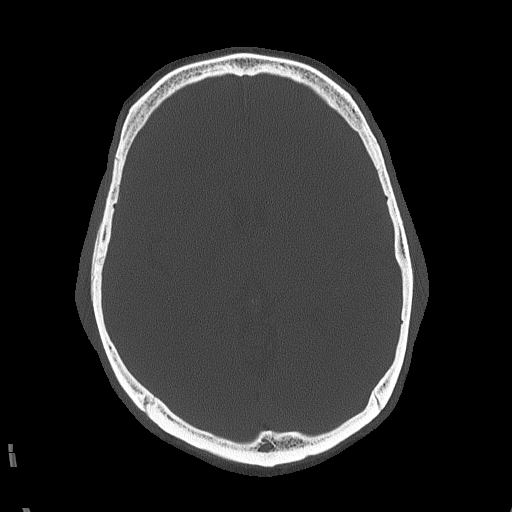

[Series 5: head without cor · coronal · non-contrast · 0.35mm/px · 3 of 67 slices shown]
[im 23/67  brain]
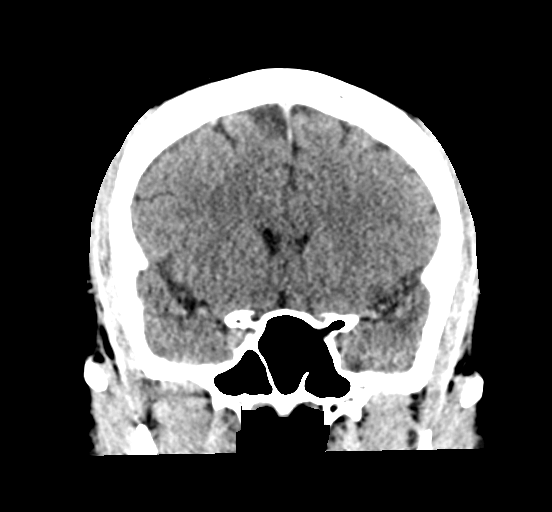
[im 30/67  brain]
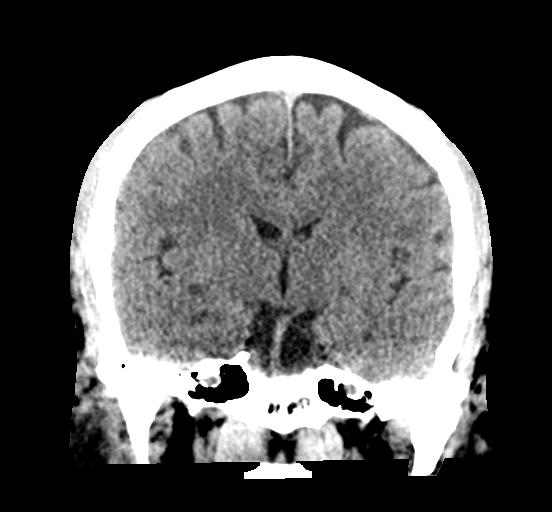
[im 37/67  brain]
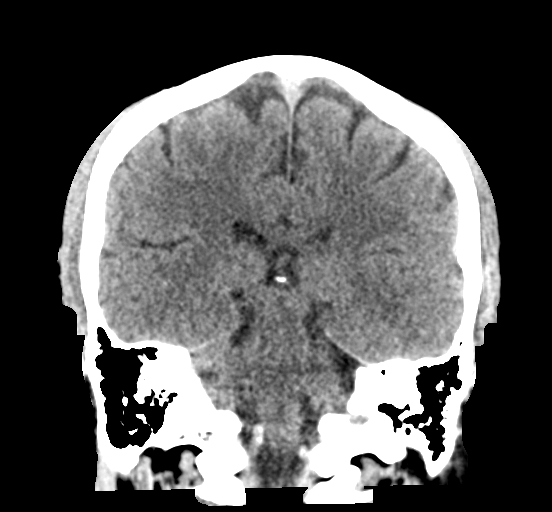

[Series 6: head without sag · sagittal · non-contrast · 0.35mm/px · 3 of 65 slices shown]
[im 22/65  brain]
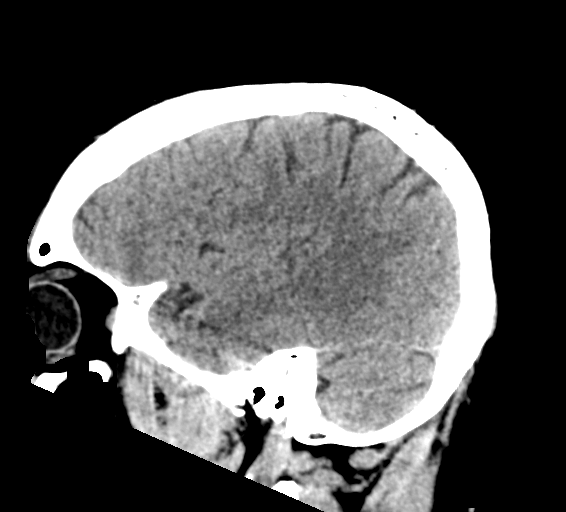
[im 33/65  brain]
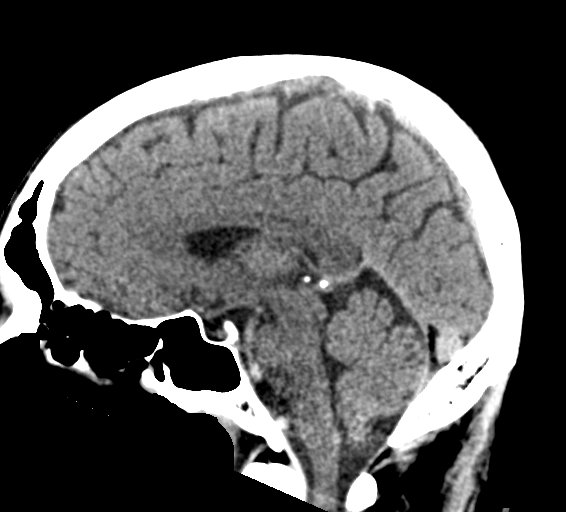
[im 43/65  brain]
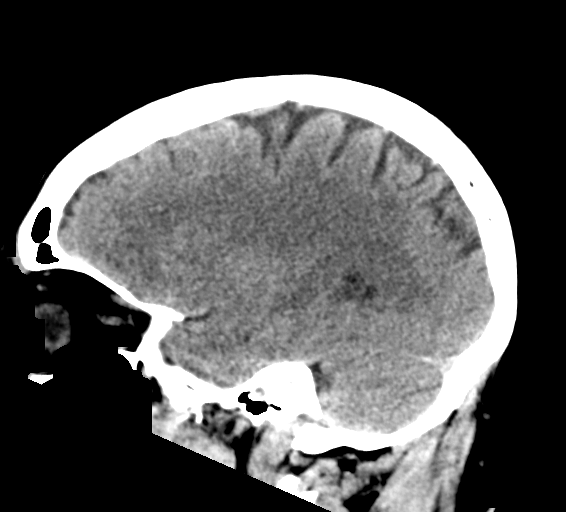

[17 of 47 positions shown; findings below may reference images not displayed]

FINDINGS: Brain: There is no mass, hemorrhage or extra-axial collection. The
size and configuration of the ventricles and extra-axial CSF spaces
are normal. There is no acute or chronic infarction. The brain
parenchyma is normal.

Vascular: No abnormal hyperdensity of the major intracranial
arteries or dural venous sinuses. No intracranial atherosclerosis.

Skull: The visualized skull base, calvarium and extracranial soft
tissues are normal.

Sinuses/Orbits: No fluid levels or advanced mucosal thickening of
the visualized paranasal sinuses. No mastoid or middle ear effusion.
The orbits are normal.
IMPRESSION: Normal head CT.

## 2020-02-03 ENCOUNTER — Encounter (HOSPITAL_COMMUNITY): Payer: Self-pay

## 2020-02-03 ENCOUNTER — Other Ambulatory Visit: Payer: Self-pay

## 2020-02-03 ENCOUNTER — Ambulatory Visit (HOSPITAL_COMMUNITY)
Admission: EM | Admit: 2020-02-03 | Discharge: 2020-02-03 | Disposition: A | Discharge status: Home / Self Care | Payer: Commercial Managed Care - PPO | Attending: Family Medicine | Admitting: Family Medicine

## 2020-02-03 DIAGNOSIS — E119 Type 2 diabetes mellitus without complications: Secondary | ICD-10-CM | POA: Diagnosis not present

## 2020-02-03 DIAGNOSIS — Z760 Encounter for issue of repeat prescription: Secondary | ICD-10-CM

## 2020-02-03 DIAGNOSIS — I10 Essential (primary) hypertension: Secondary | ICD-10-CM

## 2020-02-03 MED ORDER — HYDROCHLOROTHIAZIDE 25 MG PO TABS: 25.0000 mg | ORAL_TABLET | Freq: Every day | ORAL | 0 refills | Status: DC

## 2020-02-03 MED ORDER — METFORMIN HCL 1000 MG PO TABS: 1000.0000 mg | ORAL_TABLET | Freq: Two times a day (BID) | ORAL | 0 refills | Status: DC

## 2020-02-03 MED ORDER — DOXAZOSIN MESYLATE 2 MG PO TABS: 2.0000 mg | ORAL_TABLET | Freq: Every day | ORAL | 0 refills | Status: AC

## 2020-02-03 NOTE — ED Provider Notes (Signed)
[facility]    CSN: [id number] Arrival date & time: 02/03/20  1623      History   Chief Complaint Chief Complaint  Patient presents with   medication refill    HPI Stanley Chapman is a 54 y.o. male.   HPI  Patient is here for medicine refills he needs refills of his diabetes and blood pressure medication He has been advised for over a year that he needs to see a primary care doctor. Today we discussed this.  I told him he is not getting preventative medicine.  Immunizations.  Cancer screenings.  He is not even getting diabetes care that is appropriate by ADA recommendations, is not on an ACE inhibitor, is not lipid-lowering medication.  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 07/14/2017   DM (diabetes mellitus) ([place]) 05/14/2013   HTN (hypertension) 05/14/2013   Hyperlipidemia 05/14/2013   Special screening for malignant neoplasms, colon 05/08/2013    Past Surgical History:  Procedure Laterality Date   INGUINAL HERNIA REPAIR  AGE 67   LAPAROSCOPIC INGUINAL HERNIA WITH UMBILICAL HERNIA Left 1/93/7902   Procedure: LAPAROSCOPIC LEFT  INGUINAL HERNIA WITH MESH AND  LAPAROSCOPIC ASSISTED REPAIR OF UMBILICAL HERNIA;  Surgeon: [name], MD;  Location: [facility];  Service: General;  Laterality: Left;       Home Medications    Prior to Admission medications    Medication Sig Start Date End Date Taking? Authorizing Provider  Blood Glucose Monitoring Suppl (TRUE METRIX GO GLUCOSE METER) w/Device KIT 1 each by Does not apply route every 8 (eight) hours as needed. 02/17/16   Langeland, Leda Quail, MD  cephALEXin (KEFLEX) 500 MG capsule Take 1 capsule (500 mg total) by mouth 2 (two) times daily. 09/25/19   Jaynee Eagles, PA-C  doxazosin (CARDURA) 2 MG tablet Take 1 tablet (2 mg total) by mouth daily. 02/03/20   Raylene Everts, MD  esomeprazole (NEXIUM) 20 MG capsule Take 20 mg by mouth daily at 12 noon.    [provider]  glucose blood test strip Use as instructed 09/09/18   Robyn Haber, MD  hydrochlorothiazide (HYDRODIURIL) 25 MG tablet Take 1 tablet (25 mg total) by mouth daily. 02/03/20   Raylene Everts, MD  hydroxypropyl methylcellulose / hypromellose (ISOPTO TEARS / GONIOVISC) 2.5 % ophthalmic solution Place 1 drop into the right eye 4 (four) times daily. 10/29/18   Sharion Balloon, NP  metFORMIN (GLUCOPHAGE) 1000 MG tablet Take 1 tablet (1,000 mg total) by mouth 2 (two) times daily. 02/03/20   Raylene Everts, MD  tamsulosin (FLOMAX) 0.4 MG CAPS capsule Take 1 capsule (0.4 mg total) by mouth daily after supper. 09/25/19   Jaynee Eagles, PA-C  TRUEplus Lancets 26G MISC 1 each by Does not apply route every 8 (eight) hours as needed. 09/09/18  Robyn Haber, MD  pravastatin (PRAVACHOL) 20 MG tablet Take 1 tablet (20 mg total) by mouth daily. Patient not taking: Reported on 02/22/2018 07/14/17 09/09/18  Ladell Pier, MD    Family History Family History  Problem Relation Age of Onset   Hypertension Mother    Diabetes Mother    Cancer Mother        Not sure of type    Social History Social History   Tobacco Use   Smoking status: Never Smoker   Smokeless tobacco: Never Used  Vaping Use   Vaping Use: Never used  Substance Use Topics   Alcohol use: No   Drug use: No    Comment: former marijuana use - x 20 years ago      Allergies   Patient has no known allergies.   Review of Systems Review of Systems See HPI  Physical Exam Triage Vital Signs ED Triage Vitals  Enc Vitals Group     BP 02/03/20 1824 (!) 144/89     Pulse Rate 02/03/20 1824 78     Resp 02/03/20 1824 19     Temp 02/03/20 1824 97.9 F (36.6 C)     Temp Source 02/03/20 1824 Oral     SpO2 02/03/20 1824 100 %     Weight --      Height --      Head Circumference --      Peak Flow --      Pain Score 02/03/20 1821 0     Pain Loc --      Pain Edu? --      Excl. in [place]? --    No data found.  Updated Vital Signs BP (!) 144/89 (BP Location: Right Arm)    Pulse 78    Temp 97.9 F (36.6 C) (Oral)    Resp 19    SpO2 100%     Physical Exam Constitutional:      General: He is not in acute distress.    Appearance: He is well-developed and well-nourished.     Comments: Mask is in place  HENT:     Head: Normocephalic and atraumatic.     Mouth/Throat:     Mouth: Oropharynx is clear and moist.  Eyes:     Conjunctiva/sclera: Conjunctivae normal.     Pupils: Pupils are equal, round, and reactive to light.  Cardiovascular:     Rate and Rhythm: Normal rate and regular rhythm.     Heart sounds: Normal heart sounds.  Pulmonary:     Effort: Pulmonary effort is normal. No respiratory distress.     Breath sounds: Normal breath sounds.  Abdominal:     General: There is no distension.     Palpations: Abdomen is soft.  Musculoskeletal:        General: No edema. Normal range of motion.     Cervical back: Normal range of motion.  Skin:    General: Skin is warm and dry.  Neurological:     General: No focal deficit present.     Mental Status: He is alert.  Psychiatric:        Behavior: Behavior normal.      UC Treatments / Results  Labs (all labs ordered are listed, but only abnormal results are displayed) Labs Reviewed - No data to display  EKG   Radiology No results found.  Procedures Procedures (including critical  care time)  Medications Ordered in UC Medications - No data to display  Initial Impression /  Assessment and Plan / UC Course  I have reviewed the triage vital signs and the nursing notes.  Pertinent labs & imaging results that were available during my care of the patient were reviewed by me and considered in my medical decision making (see chart for details).      Final Clinical Impressions(s) / UC Diagnoses   Final diagnoses:  Essential hypertension, benign  Encounter for medication refill  Diabetes mellitus type 2 in nonobese [facility])     Discharge Instructions     Call [facility] tomorrow to get an appointment within the next 3 months   ED Prescriptions    Medication Sig Dispense Auth. Provider   hydrochlorothiazide (HYDRODIURIL) 25 MG tablet Take 1 tablet (25 mg total) by mouth daily. 90 tablet Raylene Everts, MD   doxazosin (CARDURA) 2 MG tablet Take 1 tablet (2 mg total) by mouth daily. 90 tablet Raylene Everts, MD   metFORMIN (GLUCOPHAGE) 1000 MG tablet Take 1 tablet (1,000 mg total) by mouth 2 (two) times daily. 180 tablet Raylene Everts, MD     PDMP not reviewed this encounter.   Raylene Everts, MD 02/03/20 [phone]

## 2020-02-03 NOTE — Discharge Instructions (Signed)
Call [facility] tomorrow to get an appointment within the next 3 months

## 2020-02-03 NOTE — ED Triage Notes (Signed)
Pt in requesting medication refill for his cardura, metformin, and hydrochlorothiazide.   states that his pcp has been closing down so hasn't been able to get his meds refill

## 2020-05-05 ENCOUNTER — Other Ambulatory Visit (HOSPITAL_COMMUNITY): Payer: Self-pay | Admitting: Urology

## 2020-05-05 ENCOUNTER — Other Ambulatory Visit: Payer: Self-pay | Admitting: Urology

## 2020-05-05 DIAGNOSIS — D4102 Neoplasm of uncertain behavior of left kidney: Secondary | ICD-10-CM

## 2020-05-05 DIAGNOSIS — D4101 Neoplasm of uncertain behavior of right kidney: Secondary | ICD-10-CM

## 2020-05-15 ENCOUNTER — Ambulatory Visit (HOSPITAL_COMMUNITY)
Admission: RE | Admit: 2020-05-15 | Discharge: 2020-05-15 | Disposition: A | Discharge status: Home / Self Care | Payer: Commercial Managed Care - PPO | Source: Ambulatory Visit | Attending: Urology | Admitting: Urology

## 2020-05-15 ENCOUNTER — Other Ambulatory Visit: Payer: Self-pay

## 2020-05-15 DIAGNOSIS — D4102 Neoplasm of uncertain behavior of left kidney: Secondary | ICD-10-CM | POA: Diagnosis present

## 2020-05-15 DIAGNOSIS — D4101 Neoplasm of uncertain behavior of right kidney: Secondary | ICD-10-CM | POA: Insufficient documentation

## 2020-05-15 MED ORDER — GADOBUTROL 1 MMOL/ML IV SOLN
10.0000 mL | Freq: Once | INTRAVENOUS | Status: AC | PRN
  Administered 2020-05-15: 10 mL via INTRAVENOUS

## 2020-06-17 ENCOUNTER — Other Ambulatory Visit: Payer: Self-pay | Admitting: Urology

## 2020-07-08 ENCOUNTER — Encounter (HOSPITAL_BASED_OUTPATIENT_CLINIC_OR_DEPARTMENT_OTHER): Payer: Self-pay | Admitting: Urology

## 2020-07-09 ENCOUNTER — Other Ambulatory Visit: Payer: Self-pay

## 2020-07-09 ENCOUNTER — Encounter (HOSPITAL_BASED_OUTPATIENT_CLINIC_OR_DEPARTMENT_OTHER): Payer: Self-pay | Admitting: Urology

## 2020-07-09 NOTE — Progress Notes (Signed)
Spoke w/ via phone for pre-op interview--- Pt Lab needs dos---- Istat and ekg              Lab results------ no COVID test -----patient states asymptomatic no test needed Arrive at ------- 0630 on 07-14-2020 NPO after MN NO Solid Food.  Clear liquids from MN until--- 0530 Med rec completed Medications to take morning of surgery ----- Doxazosin, Prilosec Diabetic medication ----- do not take metformin morning of surgery Patient instructed no nail polish to be worn day of surgery Patient instructed to bring photo id and insurance card day of surgery Patient aware to have Driver (ride ) / caregiver    for 24 hours after surgery -- friend, Milbert Coulter Patient Special Instructions ----- n/a Pre-Op special Istructions ----- n/a Patient verbalized understanding of instructions that were given at this phone interview. Patient denies shortness of breath, chest pain, fever, cough at this phone interview.

## 2020-07-14 ENCOUNTER — Ambulatory Visit (HOSPITAL_BASED_OUTPATIENT_CLINIC_OR_DEPARTMENT_OTHER)
Admission: RE | Admit: 2020-07-14 | Discharge: 2020-07-14 | Disposition: A | Discharge status: Home / Self Care | Payer: Commercial Managed Care - PPO | Attending: Urology | Admitting: Urology

## 2020-07-14 ENCOUNTER — Encounter (HOSPITAL_BASED_OUTPATIENT_CLINIC_OR_DEPARTMENT_OTHER): Payer: Self-pay | Admitting: Urology

## 2020-07-14 ENCOUNTER — Ambulatory Visit (HOSPITAL_BASED_OUTPATIENT_CLINIC_OR_DEPARTMENT_OTHER): Payer: Commercial Managed Care - PPO | Admitting: Anesthesiology

## 2020-07-14 ENCOUNTER — Encounter (HOSPITAL_BASED_OUTPATIENT_CLINIC_OR_DEPARTMENT_OTHER)
Admission: RE | Disposition: A | Discharge status: Home / Self Care | Payer: Self-pay | Source: Home / Self Care | Attending: Urology

## 2020-07-14 ENCOUNTER — Other Ambulatory Visit: Payer: Self-pay

## 2020-07-14 DIAGNOSIS — R3911 Hesitancy of micturition: Secondary | ICD-10-CM

## 2020-07-14 DIAGNOSIS — N401 Enlarged prostate with lower urinary tract symptoms: Secondary | ICD-10-CM | POA: Insufficient documentation

## 2020-07-14 DIAGNOSIS — K219 Gastro-esophageal reflux disease without esophagitis: Secondary | ICD-10-CM | POA: Diagnosis not present

## 2020-07-14 DIAGNOSIS — I10 Essential (primary) hypertension: Secondary | ICD-10-CM | POA: Insufficient documentation

## 2020-07-14 DIAGNOSIS — Z8249 Family history of ischemic heart disease and other diseases of the circulatory system: Secondary | ICD-10-CM | POA: Insufficient documentation

## 2020-07-14 DIAGNOSIS — Z7984 Long term (current) use of oral hypoglycemic drugs: Secondary | ICD-10-CM | POA: Insufficient documentation

## 2020-07-14 DIAGNOSIS — Z833 Family history of diabetes mellitus: Secondary | ICD-10-CM | POA: Diagnosis not present

## 2020-07-14 DIAGNOSIS — E119 Type 2 diabetes mellitus without complications: Secondary | ICD-10-CM | POA: Diagnosis not present

## 2020-07-14 DIAGNOSIS — Z79899 Other long term (current) drug therapy: Secondary | ICD-10-CM | POA: Insufficient documentation

## 2020-07-14 HISTORY — DX: Type 2 diabetes mellitus without complications: E11.9

## 2020-07-14 HISTORY — DX: Personal history of other diseases of the nervous system and sense organs: Z86.69

## 2020-07-14 HISTORY — DX: Other obstructive and reflux uropathy: N13.8

## 2020-07-14 HISTORY — DX: Personal history of other specified conditions: Z87.898

## 2020-07-14 HISTORY — PX: THULIUM LASER TURP (TRANSURETHRAL RESECTION OF PROSTATE): SHX6744

## 2020-07-14 LAB — POCT I-STAT, CHEM 8
BUN: 21 mg/dL — ABNORMAL HIGH (ref 6–20)
Calcium, Ion: 1.19 mmol/L (ref 1.15–1.40)
Chloride: 103 mmol/L (ref 98–111)
Creatinine, Ser: 1.5 mg/dL — ABNORMAL HIGH (ref 0.61–1.24)
Glucose, Bld: 133 mg/dL — ABNORMAL HIGH (ref 70–99)
HCT: 43 % (ref 39.0–52.0)
Hemoglobin: 14.6 g/dL (ref 13.0–17.0)
Potassium: 4 mmol/L (ref 3.5–5.1)
Sodium: 142 mmol/L (ref 135–145)
TCO2: 26 mmol/L (ref 22–32)

## 2020-07-14 LAB — GLUCOSE, CAPILLARY: Glucose-Capillary: 113 mg/dL — ABNORMAL HIGH (ref 70–99)

## 2020-07-14 SURGERY — THULIUM LASER TURP (TRANSURETHRAL RESECTION OF PROSTATE)
Anesthesia: General | Site: Prostate

## 2020-07-14 MED ORDER — DEXAMETHASONE SODIUM PHOSPHATE 10 MG/ML IJ SOLN
INTRAMUSCULAR | Status: DC | PRN
  Administered 2020-07-14: 10 mg via INTRAVENOUS

## 2020-07-14 MED ORDER — LIDOCAINE 2% (20 MG/ML) 5 ML SYRINGE
INTRAMUSCULAR | Status: DC | PRN
  Administered 2020-07-14: 80 mg via INTRAVENOUS

## 2020-07-14 MED ORDER — PROPOFOL 10 MG/ML IV BOLUS
INTRAVENOUS | Status: AC
  Filled 2020-07-14: qty 40

## 2020-07-14 MED ORDER — PROPOFOL 10 MG/ML IV BOLUS
INTRAVENOUS | Status: DC | PRN
  Administered 2020-07-14 (×2): 50 mg via INTRAVENOUS
  Administered 2020-07-14: 150 mg via INTRAVENOUS

## 2020-07-14 MED ORDER — SODIUM CHLORIDE 0.9 % IR SOLN
Status: DC | PRN
  Administered 2020-07-14 (×2): 6000 mL

## 2020-07-14 MED ORDER — LIDOCAINE HCL URETHRAL/MUCOSAL 2 % EX GEL
CUTANEOUS | Status: AC
  Filled 2020-07-14: qty 11

## 2020-07-14 MED ORDER — PROMETHAZINE HCL 25 MG/ML IJ SOLN: 6.2500 mg | INTRAMUSCULAR | Status: DC | PRN

## 2020-07-14 MED ORDER — LIDOCAINE HCL (PF) 2 % IJ SOLN
INTRAMUSCULAR | Status: AC
  Filled 2020-07-14: qty 5

## 2020-07-14 MED ORDER — BELLADONNA ALKALOIDS-OPIUM 16.2-60 MG RE SUPP
RECTAL | Status: DC | PRN
  Administered 2020-07-14: 1 via RECTAL

## 2020-07-14 MED ORDER — ACETAMINOPHEN 325 MG PO TABS: 650.0000 mg | ORAL_TABLET | Freq: Four times a day (QID) | ORAL | Status: AC | PRN

## 2020-07-14 MED ORDER — CEPHALEXIN 500 MG PO CAPS: 500.0000 mg | ORAL_CAPSULE | Freq: Every day | ORAL | 0 refills | Status: AC

## 2020-07-14 MED ORDER — MIDAZOLAM HCL 5 MG/5ML IJ SOLN
INTRAMUSCULAR | Status: DC | PRN
  Administered 2020-07-14: 2 mg via INTRAVENOUS

## 2020-07-14 MED ORDER — ONDANSETRON HCL 4 MG/2ML IJ SOLN
INTRAMUSCULAR | Status: DC | PRN
  Administered 2020-07-14: 4 mg via INTRAVENOUS

## 2020-07-14 MED ORDER — LACTATED RINGERS IV SOLN: INTRAVENOUS | Status: DC

## 2020-07-14 MED ORDER — PHENAZOPYRIDINE HCL 95 MG PO TABS: 95.0000 mg | ORAL_TABLET | Freq: Three times a day (TID) | ORAL | 0 refills | Status: AC | PRN

## 2020-07-14 MED ORDER — FENTANYL CITRATE (PF) 100 MCG/2ML IJ SOLN: 25.0000 ug | INTRAMUSCULAR | Status: DC | PRN

## 2020-07-14 MED ORDER — OXYCODONE HCL 5 MG/5ML PO SOLN: 5.0000 mg | Freq: Once | ORAL | Status: DC | PRN

## 2020-07-14 MED ORDER — CEFAZOLIN SODIUM-DEXTROSE 2-4 GM/100ML-% IV SOLN
INTRAVENOUS | Status: AC
  Filled 2020-07-14: qty 100

## 2020-07-14 MED ORDER — LIDOCAINE HCL URETHRAL/MUCOSAL 2 % EX GEL
CUTANEOUS | Status: DC | PRN
  Administered 2020-07-14: 1

## 2020-07-14 MED ORDER — MIDAZOLAM HCL 2 MG/2ML IJ SOLN
INTRAMUSCULAR | Status: AC
  Filled 2020-07-14: qty 2

## 2020-07-14 MED ORDER — BELLADONNA ALKALOIDS-OPIUM 16.2-60 MG RE SUPP
RECTAL | Status: AC
  Filled 2020-07-14: qty 1

## 2020-07-14 MED ORDER — GLYCOPYRROLATE PF 0.2 MG/ML IJ SOSY
PREFILLED_SYRINGE | INTRAMUSCULAR | Status: AC
  Filled 2020-07-14: qty 1

## 2020-07-14 MED ORDER — IBUPROFEN 400 MG PO TABS: 400.0000 mg | ORAL_TABLET | Freq: Three times a day (TID) | ORAL | 0 refills | Status: AC | PRN

## 2020-07-14 MED ORDER — FENTANYL CITRATE (PF) 100 MCG/2ML IJ SOLN
INTRAMUSCULAR | Status: AC
  Filled 2020-07-14: qty 2

## 2020-07-14 MED ORDER — FENTANYL CITRATE (PF) 250 MCG/5ML IJ SOLN
INTRAMUSCULAR | Status: DC | PRN
  Administered 2020-07-14 (×2): 25 ug via INTRAVENOUS
  Administered 2020-07-14 (×3): 50 ug via INTRAVENOUS

## 2020-07-14 MED ORDER — OXYCODONE HCL 5 MG PO TABS: 5.0000 mg | ORAL_TABLET | Freq: Once | ORAL | Status: DC | PRN

## 2020-07-14 MED ORDER — CEFAZOLIN SODIUM-DEXTROSE 2-4 GM/100ML-% IV SOLN
2.0000 g | Freq: Once | INTRAVENOUS | Status: AC
  Administered 2020-07-14: 2 g via INTRAVENOUS

## 2020-07-14 SURGICAL SUPPLY — 23 items
BAG DRAIN URO-CYSTO SKYTR STRL (DRAIN) ×3 IMPLANT
BAG DRN RND TRDRP ANRFLXCHMBR (UROLOGICAL SUPPLIES) ×1
BAG DRN UROCATH (DRAIN) ×1
BAG URINE DRAIN 2000ML AR STRL (UROLOGICAL SUPPLIES) ×3 IMPLANT
CATH COUDE FOLEY 2W 5CC 18FR (CATHETERS) IMPLANT
CATH COUDE FOLEY 2W 5CC 20FR (CATHETERS) ×3 IMPLANT
CATH FOLEY 3WAY 30CC 22FR (CATHETERS) IMPLANT
CLOTH BEACON ORANGE TIMEOUT ST (SAFETY) ×3 IMPLANT
GLOVE SURG ENC MOIS LTX SZ7.5 (GLOVE) ×3 IMPLANT
GLOVE SURG ENC MOIS LTX SZ8 (GLOVE) IMPLANT
GOWN STRL REUS W/TWL LRG LVL3 (GOWN DISPOSABLE) ×3 IMPLANT
HOLDER FOLEY CATH W/STRAP (MISCELLANEOUS) ×3 IMPLANT
IV NS IRRIG 3000ML ARTHROMATIC (IV SOLUTION) ×15 IMPLANT
KIT TURNOVER CYSTO (KITS) ×3 IMPLANT
LASER REVOLIX PROCEDURE (MISCELLANEOUS) ×3 IMPLANT
LOOP CUT BIPOLAR 24F LRG (ELECTROSURGICAL) IMPLANT
MANIFOLD NEPTUNE II (INSTRUMENTS) ×3 IMPLANT
PACK CYSTO (CUSTOM PROCEDURE TRAY) ×3 IMPLANT
SYR 30ML LL (SYRINGE) ×3 IMPLANT
TUBE CONNECTING 12'X1/4 (SUCTIONS) ×1
TUBE CONNECTING 12X1/4 (SUCTIONS) ×2 IMPLANT
TUBING UROLOGY SET (TUBING) ×3 IMPLANT
WATER STERILE IRR 500ML POUR (IV SOLUTION) ×3 IMPLANT

## 2020-07-14 NOTE — Anesthesia Postprocedure Evaluation (Signed)
Anesthesia Post Note  Patient: Stanley Chapman  Procedure(s) Performed: THULIUM LASER TURP (TRANSURETHRAL RESECTION OF PROSTATE) (N/A Prostate)     Patient location during evaluation: PACU Anesthesia Type: General Level of consciousness: awake and alert Pain management: pain level controlled Vital Signs Assessment: post-procedure vital signs reviewed and stable Respiratory status: spontaneous breathing, nonlabored ventilation and respiratory function stable Cardiovascular status: blood pressure returned to baseline and stable Postop Assessment: no apparent nausea or vomiting Anesthetic complications: no   No complications documented.  Last Vitals:  Vitals:   07/14/20 1030 07/14/20 1045  BP: (!) 175/93 (!) 164/94  Pulse: 72 69  Resp: (!) 7 15  Temp:    SpO2: 100% 98%    Last Pain:  Vitals:   07/14/20 1030  TempSrc:   PainSc: 0-No pain                 Merlinda Frederick

## 2020-07-14 NOTE — H&P (Signed)
H&P  Chief Complaint: BPH, lower urinary tract symptoms  History of Present Illness: Stanley Chapman is a 55 year old male with a history of BPH.  He has tried multiple alpha blockers including tamsulosin doxazosin and silodosin.  He gets relief and a better stream when taking medicines but has side effects.  His post void was 114 cc.  In March 2022 PSA was 0.72.  On cystoscopy May 2022 he had lateral and median lobe hypertrophy.  His urinalysis was clear.  He presents today for thulium laser vaporization of the prostate.  He has been well.  No cough, cold or congestion.  No dysuria or gross hematuria.  Past Medical History:  Diagnosis Date  . BPH with urinary obstruction   . GERD (gastroesophageal reflux disease)   . History of Bell's palsy    2010  left side w/ residual slight eye droop;  and 09/ 2020,  right side with no residual  . History of urinary retention   . Hypertension    followed by pcp   (coronary CT in epic 03-19-2011 calcium score zero with normal coronaries)  . Renal lesion    bilateral lesion's --- followed by urologist, dr Chaka Boyson (last MRI in epic 05-15-2020)  . Type 2 diabetes mellitus ([place])    Past Surgical History:  Procedure Laterality Date  . INGUINAL HERNIA REPAIR  AGE 44  . LAPAROSCOPIC INGUINAL HERNIA WITH UMBILICAL HERNIA Left 06/17/2583   Procedure: LAPAROSCOPIC LEFT  INGUINAL HERNIA WITH MESH AND  LAPAROSCOPIC ASSISTED REPAIR OF UMBILICAL HERNIA;  Surgeon: [name], MD;  Location: [facility];  Service: General;  Laterality: Left;    Home Medications:  Medications Prior to Admission  Medication Sig Dispense Refill Last Dose  . Aspirin-Salicylamide-Caffeine (BC HEADACHE PO) Take by mouth.   Past Month at Unknown time  . doxazosin (CARDURA) 2 MG tablet Take 1 tablet (2 mg total) by mouth daily. (Patient taking differently: Take 2 mg by mouth daily.) 90 tablet 0 07/14/2020 at 0305  . metFORMIN (GLUCOPHAGE) 1000 MG tablet Take 1 tablet (1,000 mg total)  by mouth 2 (two) times daily. (Patient taking differently: Take 1,000 mg by mouth 2 (two) times daily.) 180 tablet 0 07/13/2020 at Unknown time  . omeprazole (PRILOSEC) 20 MG capsule Take 20 mg by mouth daily.   07/13/2020 at Unknown time  . Blood Glucose Monitoring Suppl (TRUE METRIX GO GLUCOSE METER) w/Device KIT 1 each by Does not apply route every 8 (eight) hours as needed. 1 kit 0   . glucose blood test strip Use as instructed 100 each 12   . hydrochlorothiazide (HYDRODIURIL) 25 MG tablet Take 1 tablet (25 mg total) by mouth daily. (Patient not taking: Reported on 07/09/2020) 90 tablet 0 Not Taking at Unknown time  . tamsulosin (FLOMAX) 0.4 MG CAPS capsule Take 1 capsule (0.4 mg total) by mouth daily after supper. (Patient not taking: No sig reported) 90 capsule 1 More than a month at Unknown time  . TRUEplus Lancets 26G MISC 1 each by Does not apply route every 8 (eight) hours as needed. 100 each 12    Allergies: No Known Allergies  Family History  Problem Relation Age of Onset  . Hypertension Mother   . Diabetes Mother   . Cancer Mother        Not sure of type   Social History:  reports that he has never smoked. He has never used smokeless tobacco. He reports that he does not drink alcohol and does not use  drugs.  ROS: A complete review of systems was performed.  All systems are negative except for pertinent findings as noted. Review of Systems  All other systems reviewed and are negative.    Physical Exam:  Vital signs in last 24 hours: Temp:  [97.6 F (36.4 C)] 97.6 F (36.4 C) (06/07 0708) Pulse Rate:  [69] 69 (06/07 0708) Resp:  [17] 17 (06/07 0708) BP: (159)/(84) 159/84 (06/07 0708) SpO2:  [100 %] 100 % (06/07 0708) Weight:  [100.2 kg] 100.2 kg (06/07 0708) General:  Alert and oriented, No acute distress HEENT: Normocephalic, atraumatic Neck: No JVD or lymphadenopathy Cardiovascular: Regular rate and rhythm Lungs: Regular rate and effort Abdomen: Soft, nontender,  nondistended, no abdominal masses Extremities: No edema Neurologic: Grossly intact  Laboratory Data:  Results for orders placed or performed during the hospital encounter of 07/14/20 (from the past 24 hour(s))  I-STAT, chem 8     Status: Abnormal   Collection Time: 07/14/20  7:02 AM  Result Value Ref Range   Sodium 142 135 - 145 mmol/L   Potassium 4.0 3.5 - 5.1 mmol/L   Chloride 103 98 - 111 mmol/L   BUN 21 (H) 6 - 20 mg/dL   Creatinine, Ser 1.50 (H) 0.61 - 1.24 mg/dL   Glucose, Bld 133 (H) 70 - 99 mg/dL   Calcium, Ion 1.19 1.15 - 1.40 mmol/L   TCO2 26 22 - 32 mmol/L   Hemoglobin 14.6 13.0 - 17.0 g/dL   HCT 43.0 39.0 - 52.0 %   No results found for this or any previous visit (from the past 240 hour(s)). Creatinine: Recent Labs    07/14/20 0702  CREATININE 1.50*    Impression/Assessment/plan:  I discussed with the patient the nature, potential benefits, risks and alternatives to thulium laser vaporization of the prostate, including side effects of the proposed treatment, the likelihood of the patient achieving the goals of the procedure, and any potential problems that might occur during the procedure or recuperation.  Discussed Foley care and follow-up.  All questions answered. Patient elects to proceed.    Festus Aloe 07/14/2020, 8:26 AM

## 2020-07-14 NOTE — Discharge Instructions (Signed)
Indwelling Urinary Catheter Care, Adult An indwelling urinary catheter is a thin tube that is put into your bladder. The tube helps to drain pee (urine) out of your body. The tube goes in through your urethra. Your urethra is where pee comes out of your body. Your pee will come out through the catheter, then it will go into a bag (drainage bag). Take good care of your catheter so it will work well. How to wear your catheter and bag Supplies needed  Sticky tape (adhesive tape) or a leg strap.  Alcohol wipe or soap and water (if you use tape).  A clean towel (if you use tape).  Large overnight bag.  Smaller bag (leg bag). Wearing your catheter Attach your catheter to your leg with tape or a leg strap.  Make sure the catheter is not pulled tight.  If a leg strap gets wet, take it off and put on a dry strap.  If you use tape to hold the bag on your leg: 1. Use an alcohol wipe or soap and water to wash your skin where the tape made it sticky before. 2. Use a clean towel to pat-dry that skin. 3. Use new tape to make the bag stay on your leg. Wearing your bags You should have been given a large overnight bag.  You may wear the overnight bag in the day or night.  Always have the overnight bag lower than your bladder.  Do not let the bag touch the floor.  Before you go to sleep, put a clean plastic bag in a wastebasket. Then hang the overnight bag inside the wastebasket. You should also have a smaller leg bag that fits under your clothes.  Always wear the leg bag below your knee.  Do not wear your leg bag at night. How to care for your skin and catheter Supplies needed  A clean washcloth.  Water and mild soap.  A clean towel. Caring for your skin and catheter  Clean the skin around your catheter every day: 1. Wash your hands with soap and water. 2. Wet a clean washcloth in warm water and mild soap. 3. Clean the skin around your urethra.  If you are male:  Gently  spread the folds of skin around your vagina (labia).  With the washcloth in your other hand, wipe the inner side of your labia on each side. Wipe from front to back.  If you are male:  Pull back any skin that covers the end of your penis (foreskin).  With the washcloth in your other hand, wipe your penis in small circles. Start wiping at the tip of your penis, then move away from the catheter.  Move the foreskin back in place, if needed. 4. With your free hand, hold the catheter close to where it goes into your body.  Keep holding the catheter during cleaning so it does not get pulled out. 5. With the washcloth in your other hand, clean the catheter.  Only wipe downward on the catheter.  Do not wipe upward toward your body. Doing this may push germs into your urethra and cause infection. 6. Use a clean towel to pat-dry the catheter and the skin around it. Make sure to wipe off all soap. 7. Wash your hands with soap and water.  Shower every day. Do not take baths.  Do not use cream, ointment, or lotion on the area where the catheter goes into your body, unless your doctor tells you to.  Do not   use powders, sprays, or lotions on your genital area.  Check your skin around the catheter every day for signs of infection. Check for: ? Redness, swelling, or pain. ? Fluid or blood. ? Warmth. ? Pus or a bad smell.      How to empty the bag Supplies needed  Rubbing alcohol.  Gauze pad or cotton ball.  Tape or a leg strap. Emptying the bag Pour the pee out of your bag when it is ?- full, or at least 2-3 times a day. Do this for your overnight bag and your leg bag. 1. Wash your hands with soap and water. 2. Separate (detach) the bag from your leg. 3. Hold the bag over the toilet or a clean pail. Keep the bag lower than your hips and bladder. This is so the pee (urine) does not go back into the tube. 4. Open the pour spout. It is at the bottom of the bag. 5. Empty the pee into the  toilet or pail. Do not let the pour spout touch any surface. 6. Put rubbing alcohol on a gauze pad or cotton ball. 7. Use the gauze pad or cotton ball to clean the pour spout. 8. Close the pour spout. 9. Attach the bag to your leg with tape or a leg strap. 10. Wash your hands with soap and water. Follow instructions for cleaning the drainage bag:  From the product maker.  As told by your doctor. How to change the bag Supplies needed  Alcohol wipes.  A clean bag.  Tape or a leg strap. Changing the bag Replace your bag when it starts to leak, smell bad, or look dirty. 1. Wash your hands with soap and water. 2. Separate the dirty bag from your leg. 3. Pinch the catheter with your fingers so that pee does not spill out. 4. Separate the catheter tube from the bag tube where these tubes connect (at the connection valve). Do not let the tubes touch any surface. 5. Clean the end of the catheter tube with an alcohol wipe. Use a different alcohol wipe to clean the end of the bag tube. 6. Connect the catheter tube to the tube of the clean bag. 7. Attach the clean bag to your leg with tape or a leg strap. Do not make the bag tight on your leg. 8. Wash your hands with soap and water. General rules  Never pull on your catheter. Never try to take it out. Doing that can hurt you.  Always wash your hands before and after you touch your catheter or bag. Use a mild, fragrance-free soap. If you do not have soap and water, use hand sanitizer.  Always make sure there are no twists or bends (kinks) in the catheter tube.  Always make sure there are no leaks in the catheter or bag.  Drink enough fluid to keep your pee pale yellow.  Do not take baths, swim, or use a hot tub.  If you are male, wipe from front to back after you poop (have a bowel movement).   Contact a doctor if:  Your pee is cloudy.  Your pee smells worse than usual.  Your catheter gets clogged.  Your catheter  leaks.  Your bladder feels full. Get help right away if:  You have redness, swelling, or pain where the catheter goes into your body.  You have fluid, blood, pus, or a bad smell coming from the area where the catheter goes into your body.  Your skin feels   warm where the catheter goes into your body.  You have a fever.  You have pain in your: ? Belly (abdomen). ? Legs. ? Lower back. ? Bladder.  You see blood in the catheter.  Your pee is pink or red.  You feel sick to your stomach (nauseous).  You throw up (vomit).  You have chills.  Your pee is not draining into the bag.  Your catheter gets pulled out. Summary  An indwelling urinary catheter is a thin tube that is placed into the bladder to help drain pee (urine) out of the body.  The catheter is placed into the part of the body that drains pee from the bladder (urethra).  Taking good care of your catheter will keep it working properly and help prevent problems.  Always wash your hands before and after touching your catheter or bag.  Never pull on your catheter or try to take it out. This information is not intended to replace advice given to you by your health care provider. Make sure you discuss any questions you have with your health care provider. Document Revised: 05/18/2018 Document Reviewed: 09/09/2016 Elsevier Patient Education  [facility], Care After This sheet gives you information about how to care for yourself after your procedure. Your health care provider may also give you more specific instructions. If you have problems or questions, contact your health care provider. What can I expect after the procedure? For the first few weeks after the procedure, you may have:  A need to urinate often.  Blood in your urine.  A sudden need to urinate. After the thin, flexible tube called a urinary catheter is removed, you may have a burning feeling when you urinate. You will  feel this especially at the end of urination. This feeling usually passes within 3-5 days. Follow these instructions at home: Medicines  Take over-the-counter and prescription medicines, including stool softeners, only as told by your health care provider.  If you were given a medicine to help you relax (sedative) during the procedure, it can affect you for several hours. Do not drive or operate machinery until your health care provider says that it is safe.  If you were prescribed an antibiotic medicine, take it as told by your health care provider. Do not stop using the antibiotic even if you start to feel better. Activity  Rest as told by your health care provider.  Do not take baths, swim, or use a hot tub until your health care provider approves. Ask your health care provider if you may take showers. You may only be allowed to take sponge baths.  Do not do exercise that takes a lot of effort (is strenuous) for 1 week or as told by your health care provider.  Do not lift anything that is heavier than 10 lb (4.5 kg), or the limit that you are told, until your health care provider says that it is safe.  Avoid sexual activity for 4-6 weeks or as told by your health care provider.  Do not ride in a car for extended periods of time for 1 month or as told by your health care provider.  Return to your normal activities as told by your health care provider. Ask your health care provider what activities are safe for you.   General instructions  Do not strain to have a bowel movement. Straining may lead to bleeding from the prostate, cause clots to form, and cause trouble urinating.  Eat foods  that are high in fiber, such as fresh fruits and vegetables, whole grains, and beans.  Do not use any products that contain nicotine or tobacco, such as cigarettes, e-cigarettes, and chewing tobacco. If you need help quitting, ask your health care provider.  Keep all follow-up visits as told by your  health care provider. This is important. Contact a health care provider if:  You have a fever or chills.  You have spasms or pain with the urinary catheter still in place.  You struggle to start a urine stream when trying to urinate after the catheter has been removed.  You have trouble having or keeping an erection.  No semen comes out during orgasm (dry ejaculation). Get help right away if:  There is a blockage in your catheter.  Your catheter has been removed and you suddenly cannot urinate.  Your urine smells unusually bad.  You start to have blood clots in your urine.  The blood in your urine does not go away or gets thick.  You develop chest pains or shortness of breath.  You develop swelling or pain in your leg. These symptoms may represent a serious problem that is an emergency. Do not wait to see if the symptoms will go away. Get medical help right away. Call your local emergency services (911 in the [place] Do not drive yourself to the hospital. Summary  After the procedure, you may notice urinary symptoms for a few weeks.  Follow instructions from your health care provider about restrictions for activities such as lifting, exercise, and sex.  Contact your health care provider if you have fever or chills, spasms or pain while the catheter is in place, or trouble starting a urine stream after the catheter has been removed. This information is not intended to replace advice given to you by your health care provider. Make sure you discuss any questions you have with your health care provider. Document Revised: 03/04/2019 Document Reviewed: 03/04/2019 Elsevier Patient Education  2021 [place] Instructions  Activity: Get plenty of rest for the remainder of the day. A responsible individual must stay with you for 24 hours following the procedure.  For the next 24 hours, DO NOT: -Drive a car -Paediatric nurse -Drink alcoholic  beverages -Take any medication unless instructed by your physician -Make any legal decisions or sign important papers.  Meals: Start with liquid foods such as gelatin or soup. Progress to regular foods as tolerated. Avoid greasy, spicy, heavy foods. If nausea and/or vomiting occur, drink only clear liquids until the nausea and/or vomiting subsides. Call your physician if vomiting continues.  Special Instructions/Symptoms: Your throat may feel dry or sore from the anesthesia or the breathing tube placed in your throat during surgery. If this causes discomfort, gargle with warm salt water. The discomfort should disappear within 24 hours.

## 2020-07-14 NOTE — Op Note (Signed)
Preoperative diagnosis: BPH, lower urinary tract symptoms Postoperative diagnosis: Same  Procedure: Cystoscopy, thulium laser vaporization of the prostate  Surgeon: Junious Silk  Anesthesia: General  Indication for procedure: Stanley Chapman is a 55 year old male with a history of BPH.  He is failed multiple alpha blockers.  He presented for above procedure.  Findings: On exam the penis was circumcised without mass or lesion.  Glans and meatus appeared normal.  On DRE prostate was about 40 g and smooth without hard area or nodule.  On cystoscopy the urethra was unremarkable, prostatic urethra obstructed by primarily a median lobe with some lateral lobe hypertrophy as well.  Bladder was moderately trabeculated.  No mucosal lesions were noted.  No stone or foreign body noted.  Description of procedure: After consent was obtained patient brought to the operating room.  After adequate anesthesia placed lithotomy position and prepped and draped in usual sterile fashion.  Timeout was performed to confirm the patient and procedure.  Cystoscope was passed per urethra and the bladder inspected.  The 800 m laser fiber was advanced and made incisions at 5 and 7:00 down through the bladder neck to the verumontanum after marking bilateral ureteral orifice ease.  The median lobe was then vaporized.  Then anterior to posterior bladder neck down to the verumontanum the lateral lobes were vaporized.  This created an excellent channel.  Hemostasis was excellent at low pressure.  The bladder was filled and the scope removed.  A 20 French coud catheter was placed in left to gravity drainage.  20 cc in the balloon and seated at the bladder neck.  Irrigation was clear.  He was awakened taken the cover room in stable condition.  Complications: None  Blood loss: Minimal  Specimens: None  Drains: 20 French coud catheter  Disposition: Patient stable to PACU

## 2020-07-14 NOTE — Anesthesia Procedure Notes (Signed)
Procedure Name: LMA Insertion Date/Time: 07/14/2020 9:00 AM Performed by: Myna Bright, CRNA Pre-anesthesia Checklist: Patient identified, Emergency Drugs available, Suction available and Patient being monitored Patient Re-evaluated:Patient Re-evaluated prior to induction Oxygen Delivery Method: Circle system utilized Preoxygenation: Pre-oxygenation with 100% oxygen Induction Type: IV induction Ventilation: Mask ventilation without difficulty LMA: LMA inserted LMA Size: 5.0 Number of attempts: 1 Placement Confirmation: positive ETCO2 and breath sounds checked- equal and bilateral Tube secured with: Tape Dental Injury: Teeth and Oropharynx as per pre-operative assessment

## 2020-07-14 NOTE — Transfer of Care (Signed)
Immediate Anesthesia Transfer of Care Note  Patient: Stanley Chapman  Procedure(s) Performed: Marcelino Duster LASER TURP (TRANSURETHRAL RESECTION OF PROSTATE) (N/A Prostate)  Patient Location: PACU  Anesthesia Type:General  Level of Consciousness: sedated and responds to stimulation  Airway & Oxygen Therapy: Patient Spontanous Breathing and Patient connected to face mask oxygen  Post-op Assessment: Report given to RN, Post -op Vital signs reviewed and stable and Patient moving all extremities  Post vital signs: Reviewed and stable  Last Vitals:  Vitals Value Taken Time  BP 144/96 07/14/20 1009  Temp    Pulse 89 07/14/20 1010  Resp 10 07/14/20 1010  SpO2 100 % 07/14/20 1010  Vitals shown include unvalidated device data.  Last Pain:  Vitals:   07/14/20 0708  TempSrc: Oral  PainSc: 0-No pain      Patients Stated Pain Goal: 5 (14/60/47 9987)  Complications: No complications documented.

## 2020-07-14 NOTE — Anesthesia Preprocedure Evaluation (Addendum)
Anesthesia Evaluation  Patient identified by MRN, date of birth, ID band Patient awake    Reviewed: Allergy & Precautions, NPO status , Patient's Chart, lab work & pertinent test results  Airway Mallampati: III  TM Distance: >3 FB Neck ROM: Full    Dental no notable dental hx.    Pulmonary neg pulmonary ROS,    Pulmonary exam normal breath sounds clear to auscultation       Cardiovascular hypertension, Pt. on medications negative cardio ROS Normal cardiovascular exam Rhythm:Regular Rate:Normal     Neuro/Psych negative neurological ROS  negative psych ROS   GI/Hepatic Neg liver ROS, GERD  Medicated and Controlled,  Endo/Other  negative endocrine ROSdiabetes, Type 2, Oral Hypoglycemic Agents  Renal/GU negative Renal ROS  negative genitourinary   Musculoskeletal negative musculoskeletal ROS (+)   Abdominal   Peds negative pediatric ROS (+)  Hematology negative hematology ROS (+)   Anesthesia Other Findings   Reproductive/Obstetrics negative OB ROS                            Anesthesia Physical Anesthesia Plan  ASA: II  Anesthesia Plan: General   Post-op Pain Management:    Induction: Intravenous  PONV Risk Score and Plan: 2  Airway Management Planned: LMA  Additional Equipment: None  Intra-op Plan:   Post-operative Plan: Extubation in OR  Informed Consent: I have reviewed the patients History and Physical, chart, labs and discussed the procedure including the risks, benefits and alternatives for the proposed anesthesia with the patient or authorized representative who has indicated his/her understanding and acceptance.     Dental advisory given  Plan Discussed with: CRNA, Anesthesiologist and Surgeon  Anesthesia Plan Comments:        Anesthesia Quick Evaluation

## 2020-07-15 ENCOUNTER — Encounter (HOSPITAL_BASED_OUTPATIENT_CLINIC_OR_DEPARTMENT_OTHER): Payer: Self-pay | Admitting: Urology

## 2020-11-13 ENCOUNTER — Ambulatory Visit: Payer: Self-pay | Admitting: Family

## 2020-11-13 ENCOUNTER — Encounter: Payer: Self-pay | Admitting: Family

## 2020-11-13 ENCOUNTER — Other Ambulatory Visit: Payer: Self-pay

## 2020-11-13 VITALS — BP 126/81 | HR 72 | Temp 97.6°F | Resp 16 | Ht 69.0 in | Wt 210.6 lb

## 2020-11-13 DIAGNOSIS — I10 Essential (primary) hypertension: Secondary | ICD-10-CM

## 2020-11-13 DIAGNOSIS — E119 Type 2 diabetes mellitus without complications: Secondary | ICD-10-CM

## 2020-11-13 MED ORDER — HYDROCHLOROTHIAZIDE 25 MG PO TABS: 25.0000 mg | ORAL_TABLET | Freq: Every day | ORAL | 0 refills | Status: DC

## 2020-11-13 MED ORDER — METFORMIN HCL 1000 MG PO TABS: 1000.0000 mg | ORAL_TABLET | Freq: Two times a day (BID) | ORAL | 1 refills | Status: AC

## 2020-11-13 MED ORDER — TRUEPLUS LANCETS 26G MISC: 1.0000 | Freq: Three times a day (TID) | 12 refills | Status: AC | PRN

## 2020-11-13 NOTE — Progress Notes (Signed)
Stanley Chapman, is a 55 y.o. male  LDJ:[id number]  QZE:[id number]  DOB - May 30, 1965  Subjective:  Chief Complaint and HPI: Stanley Chapman is a 55 y.o. male here today for medications refills.  Patient is out of metformin and hydrochlorothiazide.  Diagnosed with type 2 diabetes over 3 years ago, takes metformin 1000 mg p.o. twice a day.  Patient says he just lost his health insurance, but is getting anyone by December 2022.  Denies polyuria, polyphagia, polydipsia, blurry vision, chest pain, shortness of breath, or any other symptom.  ED/Hospital notes reviewed.   Social History: Reviewed reviewed Family history:  ROS:   Constitutional:  No f/c, No night sweats, No unexplained weight loss. EENT:  No vision changes, No blurry vision, No hearing changes. No mouth, throat, or ear problems.  Respiratory: No cough, No SOB Cardiac: No CP, no palpitations GI:  No abd pain, No N/V/D. GU: No Urinary s/sx Musculoskeletal: No joint pain Neuro: No headache, no dizziness, no motor weakness.  Skin: No rash Endocrine:  No polydipsia. No polyuria.  Psych: Denies SI/HI  No problems updated.  ALLERGIES: No Known Allergies  PAST MEDICAL HISTORY: Past Medical History:  Diagnosis Date   BPH with urinary obstruction    GERD (gastroesophageal reflux disease)    History of Bell's palsy    2010  left side w/ residual slight eye droop;  and 09/ 2020,  right side with no residual   History of urinary retention    Hypertension    followed by pcp   (coronary CT in epic 03-19-2011 calcium score zero with normal coronaries)   Renal lesion    bilateral lesion's --- followed by urologist, dr Junious Silk (last MRI in epic 05-15-2020)   Type 2 diabetes mellitus ([place])     MEDICATIONS AT HOME: Prior to Admission medications   Medication Sig Start Date End Date Taking? Authorizing Provider  acetaminophen (TYLENOL) 325 MG tablet Take 2 tablets (650 mg total) by mouth every 6 (six) hours as needed. 07/14/20    Festus Aloe, MD  Aspirin-Salicylamide-Caffeine (BC HEADACHE PO) Take by mouth.    [provider]  Blood Glucose Monitoring Suppl (TRUE METRIX GO GLUCOSE METER) w/Device KIT 1 each by Does not apply route every 8 (eight) hours as needed. 02/17/16   Maren Reamer, MD  cephALEXin (KEFLEX) 500 MG capsule Take 1 capsule (500 mg total) by mouth at bedtime. 07/14/20   Festus Aloe, MD  doxazosin (CARDURA) 2 MG tablet Take 1 tablet (2 mg total) by mouth daily. Patient taking differently: Take 2 mg by mouth daily. 02/03/20   Raylene Everts, MD  glucose blood test strip Use as instructed 09/09/18   Robyn Haber, MD  hydrochlorothiazide (HYDRODIURIL) 25 MG tablet Take 1 tablet (25 mg total) by mouth daily. 11/13/20   Feliberto Gottron, FNP  ibuprofen (ADVIL) 400 MG tablet Take 1 tablet (400 mg total) by mouth every 8 (eight) hours as needed. 07/14/20   Festus Aloe, MD  metFORMIN (GLUCOPHAGE) 1000 MG tablet Take 1 tablet (1,000 mg total) by mouth 2 (two) times daily. 11/13/20 02/11/21  Feliberto Gottron, FNP  omeprazole (PRILOSEC) 20 MG capsule Take 20 mg by mouth daily.    [provider]  phenazopyridine (AZO-TABS) 95 MG tablet Take 1 tablet (95 mg total) by mouth 3 (three) times daily as needed for pain. 07/14/20   Festus Aloe, MD  tamsulosin (FLOMAX) 0.4 MG CAPS capsule Take 1 capsule (0.4 mg total) by mouth daily after supper. Patient  not taking: No sig reported 09/25/19   Jaynee Eagles, PA-C  TRUEplus Lancets 26G MISC 1 each by Does not apply route every 8 (eight) hours as needed. 11/13/20 02/11/21  Feliberto Gottron, FNP  pravastatin (PRAVACHOL) 20 MG tablet Take 1 tablet (20 mg total) by mouth daily. Patient not taking: Reported on 02/22/2018 07/14/17 09/09/18  Ladell Pier, MD     Objective:  EXAM:   Vitals:   11/13/20 1621  BP: 126/81  Pulse: 72  Resp: 16  Temp: 97.6 F (36.4 C)  TempSrc: Oral  SpO2: 98%  Weight: 210 lb 9.6 oz (95.5 kg)    General  appearance : A&OX3. NAD. Non-toxic-appearing HEENT: Atraumatic and Normocephalic.  PERRLA. EOM intact.  TM clear B. Mouth-MMM, post pharynx WNL w/o erythema, No PND. Neck: supple, no JVD. No cervical lymphadenopathy. No thyromegaly Chest/Lungs:  Breathing-non-labored, Good air entry bilaterally, breath sounds normal without rales, rhonchi, or wheezing  CVS: S1 S2 regular, no murmurs, gallops, rubs  Abdomen: Bowel sounds present, Non tender and not distended with no gaurding, rigidity or rebound. Extremities: Bilateral Lower Ext shows no edema, both legs are warm to touch with = pulse throughout Neurology:  CN II-XII grossly intact, Non focal.   Psych:  TP linear. J/I WNL. Normal speech. Appropriate eye contact and affect.  Skin:  No Rash  Data Review Lab Results  Component Value Date   HGBA1C 7.1 (H) 08/21/2019   HGBA1C 6.8 (H) 09/09/2018   HGBA1C 6.2 (H) 06/11/2017     Assessment & Plan   1. Type 2 diabetes mellitus without complication, without long-term current use of insulin (HCC)  - metFORMIN (GLUCOPHAGE) 1000 MG tablet; Take 1 tablet (1,000 mg total) by mouth 2 (two) times daily.  Dispense: 180 tablet; Refill: 1 - POCT glycosylated hemoglobin (Hb A1C) - CMP14+EGFR - CBC with Differential - Lipid Panel - Hemoglobin A1c - TRUEplus Lancets 26G MISC; 1 each by Does not apply route every 8 (eight) hours as needed.  Dispense: 100 each; Refill: 12  2. Primary hypertension - hydrochlorothiazide (HYDRODIURIL) 25 MG tablet; Take 1 tablet (25 mg total) by mouth daily.  Dispense: 90 tablet; Refill: 0     Patient have been counseled extensively about nutrition and exercise  Return in about 3 months (around 02/13/2021).  The patient was given clear instructions to go to ER or return to medical center if symptoms don't improve, worsen or new problems develop. The patient verbalized understanding. The patient was told to call to get lab results if they haven't heard anything in the  next week.     Feliberto Gottron, PA-C [facility] [place], [place]   11/13/2020, 5:23 PM

## 2020-11-13 NOTE — Patient Instructions (Signed)
Check BG daily at least Take meds as prescribed Once you have your insurance come back for labs and diabetes management Report new or worsening symptoms to the clinic Have your eyes examined once a year at least

## 2021-05-24 ENCOUNTER — Emergency Department (HOSPITAL_COMMUNITY)
Admission: EM | Admit: 2021-05-24 | Discharge: 2021-05-24 | Discharge status: Against Medical Advice | Payer: Self-pay | Attending: Emergency Medicine | Admitting: Emergency Medicine

## 2021-05-24 ENCOUNTER — Emergency Department (HOSPITAL_COMMUNITY): Payer: Self-pay

## 2021-05-24 ENCOUNTER — Encounter (HOSPITAL_COMMUNITY): Payer: Self-pay

## 2021-05-24 DIAGNOSIS — E119 Type 2 diabetes mellitus without complications: Secondary | ICD-10-CM | POA: Insufficient documentation

## 2021-05-24 DIAGNOSIS — I2510 Atherosclerotic heart disease of native coronary artery without angina pectoris: Secondary | ICD-10-CM | POA: Insufficient documentation

## 2021-05-24 DIAGNOSIS — R072 Precordial pain: Secondary | ICD-10-CM | POA: Insufficient documentation

## 2021-05-24 DIAGNOSIS — R0602 Shortness of breath: Secondary | ICD-10-CM | POA: Insufficient documentation

## 2021-05-24 DIAGNOSIS — Z5321 Procedure and treatment not carried out due to patient leaving prior to being seen by health care provider: Secondary | ICD-10-CM | POA: Insufficient documentation

## 2021-05-24 DIAGNOSIS — Z955 Presence of coronary angioplasty implant and graft: Secondary | ICD-10-CM | POA: Insufficient documentation

## 2021-05-24 DIAGNOSIS — I10 Essential (primary) hypertension: Secondary | ICD-10-CM | POA: Insufficient documentation

## 2021-05-24 LAB — BASIC METABOLIC PANEL
Anion gap: 8 (ref 5–15)
BUN: 13 mg/dL (ref 6–20)
CO2: 25 mmol/L (ref 22–32)
Calcium: 9.3 mg/dL (ref 8.9–10.3)
Chloride: 106 mmol/L (ref 98–111)
Creatinine, Ser: 1.32 mg/dL — ABNORMAL HIGH (ref 0.61–1.24)
GFR, Estimated: >60 mL/min (ref >60)
Glucose, Bld: 140 mg/dL — ABNORMAL HIGH (ref 70–99)
Potassium: 4.1 mmol/L (ref 3.5–5.1)
Sodium: 139 mmol/L (ref 135–145)

## 2021-05-24 LAB — CBC
HCT: 45.3 % (ref 39.0–52.0)
Hemoglobin: 14.7 g/dL (ref 13.0–17.0)
MCH: 27.9 pg (ref 26.0–34.0)
MCHC: 32.5 g/dL (ref 30.0–36.0)
MCV: 86.1 fL (ref 80.0–100.0)
Platelets: 303 10*3/uL (ref 150–400)
RBC: 5.26 MIL/uL (ref 4.22–5.81)
RDW: 13.2 % (ref 11.5–15.5)
WBC: 4.6 10*3/uL (ref 4.0–10.5)
nRBC: 0 % (ref 0.0–0.2)

## 2021-05-24 LAB — TROPONIN I (HIGH SENSITIVITY): Troponin I (High Sensitivity): 3 ng/L (ref <18)

## 2021-05-24 NOTE — ED Notes (Signed)
I called patient name in the lobby and outside to collect labs and no one responded. ?

## 2021-05-24 NOTE — ED Triage Notes (Addendum)
Pt arrived via POV, c/o chest pain x3 days, continuous, non radiating, non reproducible.  ?

## 2021-05-24 NOTE — ED Provider Triage Note (Signed)
Emergency Medicine Provider Triage Evaluation Note ? ?Stanley Chapman , a 56 y.o. male  was evaluated in triage.  Pt complains of constant sternal chest pain onset 3 days.  He notes his chest pain is nonradiating.  Patient notes that he works a [employer] job and denies recent increase in heavy lifting, trauma, injury.  Patient notes this morning he had some shortness of breath.  Denies past medical history of MI, CAD, cardiac catheterization, stents.  Denies fever, chills, abdominal pain, nausea, vomiting.  Patient has a history of hypertension and diabetes and is compliant with his medications. ? ?Review of Systems  ?Positive: As per HPI above ?Negative:  ? ?Physical Exam  ?BP (!) 147/88 (BP Location: Right Arm)   Pulse 73   Temp 98.5 ?F (36.9 ?C) (Oral)   Resp 15   Ht 6' (1.829 m)   Wt 104.3 kg   SpO2 100%   BMI 31.19 kg/m?  ?Gen:   Awake, no distress   ?Resp:  Normal effort  ?MSK:   Moves extremities without difficulty  ?Other:  Mild sternal chest wall tenderness to palpation.  No obvious deformities. ? ?Medical Decision Making  ?Medically screening exam initiated at 9:23 AM.  Appropriate orders placed.  [name] was informed that the remainder of the evaluation will be completed by another provider, this initial triage assessment does not replace that evaluation, and the importance of remaining in the ED until their evaluation is complete. ?  ?Matthewjames Petrasek A, PA-C ?05/24/21 0272 ? ?

## 2021-05-26 ENCOUNTER — Ambulatory Visit
Admission: EM | Admit: 2021-05-26 | Discharge: 2021-05-26 | Disposition: A | Discharge status: Home / Self Care | Payer: Self-pay | Attending: Family Medicine | Admitting: Family Medicine

## 2021-05-26 DIAGNOSIS — Z760 Encounter for issue of repeat prescription: Secondary | ICD-10-CM

## 2021-05-26 DIAGNOSIS — I10 Essential (primary) hypertension: Secondary | ICD-10-CM

## 2021-05-26 DIAGNOSIS — Z0130 Encounter for examination of blood pressure without abnormal findings: Secondary | ICD-10-CM

## 2021-05-26 MED ORDER — HYDROCHLOROTHIAZIDE 25 MG PO TABS: 25.0000 mg | ORAL_TABLET | Freq: Every day | ORAL | 0 refills | Status: AC

## 2021-05-26 NOTE — ED Provider Notes (Signed)
?[facility] ? ? ? ?CSN: [id number] ?Arrival date & time: 05/26/21  1656 ? ? ?  ? ?History   ?Chief Complaint ?No chief complaint on file. ? ? ?HPI ?Stanley Chapman is a 56 y.o. male.  ? ?HPI ?Patient presents today initially requesting a lipid panel.  Patient was seen in the ER yesterday for chest pain and had a complete work-up which was negative for ACS.  He is scheduled to see his primary care provider on 06/01/2021.  He reports several years ago being placed on a cholesterol medication however has not recently been taking any cholesterol medications.  He is requesting a few days of his blood pressure medication to carry him until he meets with his primary care doctor.  He was concerned that his blood pressure was elevated however his blood pressure stable at 130/90. ? ?Past Medical History:  ?Diagnosis Date  ? BPH with urinary obstruction   ? GERD (gastroesophageal reflux disease)   ? History of Bell's palsy   ? 2010  left side w/ residual slight eye droop;  and 09/ 2020,  right side with no residual  ? History of urinary retention   ? Hypertension   ? followed by pcp   (coronary CT in epic 03-19-2011 calcium score zero with normal coronaries)  ? Renal lesion   ? bilateral lesion's --- followed by urologist, dr eskridge (last MRI in epic 05-15-2020)  ? Type 2 diabetes mellitus ([place])   ? ? ?Patient Active Problem List  ? Diagnosis Date Noted  ? Benign prostatic hyperplasia with lower urinary tract symptoms 07/14/2017  ? 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 DM (diabetes mellitus) ([place]) 05/14/2013  ? HTN (hypertension) 05/14/2013  ? Hyperlipidemia 05/14/2013  ? Special screening for malignant neoplasms, colon 05/08/2013  ? ? ?Past Surgical History:  ?Procedure Laterality Date  ? INGUINAL HERNIA REPAIR  AGE 60  ? LAPAROSCOPIC INGUINAL HERNIA WITH UMBILICAL HERNIA Left 8/41/3244  ? Procedure: LAPAROSCOPIC LEFT  INGUINAL HERNIA WITH MESH AND  LAPAROSCOPIC  ASSISTED REPAIR OF UMBILICAL HERNIA;  Surgeon: [name], MD;  Location: [facility];  Service: General;  Laterality: Left;  ? THULIUM LASER TURP (TRANSURETHRAL RESECTION OF PROSTATE) N/A 07/14/2020  ? Procedure: THULIUM LASER TURP (TRANSURETHRAL RESECTION OF PROSTATE);  Surgeon: Festus Aloe, MD;  Location: [facility];  Service: Urology;  Laterality: N/A;  ? ? ? ? ? ?Home Medications   ? ?Prior to Admission medications   ?Medication Sig Start Date End Date Taking? Authorizing Provider  ?acetaminophen (TYLENOL) 325 MG tablet Take 2 tablets (650 mg total) by mouth every 6 (six) hours as needed. 07/14/20   Festus Aloe, MD  ?Aspirin-Salicylamide-Caffeine [facility] HEADACHE PO) Take by mouth.    [provider]  ?Blood Glucose Monitoring Suppl (TRUE METRIX GO GLUCOSE METER) w/Device KIT 1 each by Does not apply route every 8 (eight) hours as needed. 02/17/16   Maren Reamer, MD  ?cephALEXin (KEFLEX) 500 MG capsule Take 1 capsule (500 mg total) by mouth at bedtime. 07/14/20   Festus Aloe, MD  ?doxazosin (CARDURA) 2 MG tablet Take 1 tablet (2 mg total) by mouth daily. ?Patient taking differently: Take 2 mg by mouth daily. 02/03/20   Raylene Everts, MD  ?glucose blood test strip Use as instructed 09/09/18   Robyn Haber, MD  ?hydrochlorothiazide (HYDRODIURIL) 25 MG tablet Take 1 tablet (25 mg total) by mouth daily for 7 days. 05/26/21 06/02/21  Scot Jun, FNP  ?ibuprofen (ADVIL) 400 MG tablet Take 1 tablet (400 mg total) by mouth every 8 (eight) hours as needed. 07/14/20   Festus Aloe, MD  ?metFORMIN (GLUCOPHAGE) 1000 MG tablet Take 1 tablet (1,000 mg total) by mouth 2 (two) times daily. 11/13/20 02/11/21  Feliberto Gottron, FNP  ?omeprazole (PRILOSEC) 20 MG capsule Take 20 mg by mouth daily.    [provider]  ?phenazopyridine (AZO-TABS) 95 MG tablet Take 1 tablet (95 mg total) by mouth 3 (three) times daily as needed for pain. 07/14/20   Festus Aloe, MD  ?tamsulosin (FLOMAX) 0.4 MG CAPS capsule Take 1 capsule (0.4 mg total) by mouth daily after supper. ?Patient not taking: No sig reported 09/25/19   Jaynee Eagles, PA-C  ?pravastatin (PRAVACHOL) 20 MG tablet Take 1 tablet (20 mg total) by mouth daily. ?Patient not taking: Reported on 02/22/2018 07/14/17 09/09/18  Ladell Pier, MD  ? ? ?Family History ?Family History  ?Problem Relation Age of Onset  ? Hypertension Mother   ? Diabetes Mother   ? Cancer Mother   ?     Not sure of type  ? ? ?Social History ?Social History  ? ?Tobacco Use  ? Smoking status: Never  ? Smokeless tobacco: Never  ?Vaping Use  ? Vaping Use: Never used  ?Substance Use Topics  ? Alcohol use: No  ? Drug use: No  ?  Comment: former marijuana use - x 20 years ago  ? ? ? ?Allergies   ?Patient has no known allergies. ? ? ?Review of Systems ?Review of Systems ?Pertinent negatives listed in HPI  ? ?Physical Exam ?Triage Vital Signs ?ED Triage Vitals [05/26/21 1729]  ?Enc Vitals Group  ?   BP 130/90  ?   Pulse Rate 88  ?   Resp 17  ?   Temp 98.2 ?F (36.8 ?C)  ?   Temp Source Oral  ?   SpO2 98 %  ?   Weight   ?   Height   ?   Head Circumference   ?   Peak Flow   ?   Pain Score   ?   Pain Loc   ?   Pain Edu?   ?   Excl. in [place]?   ? ?No data found. ? ?Updated Vital Signs ?BP 130/90 (BP Location: Left Arm)   Pulse 88   Temp 98.2 ?F (36.8 ?C) (Oral)   Resp 17   SpO2 98%  ? ?Visual Acuity ?Right Eye Distance:   ?Left Eye Distance:   ?Bilateral Distance:   ? ?Right Eye Near:   ?Left Eye Near:    ?Bilateral Near:    ? ?Physical Exam ?Constitutional:   ?   Appearance: Normal appearance.  ?HENT:  ?   Head: Normocephalic and atraumatic.  ?Eyes:  ?   Extraocular Movements: Extraocular movements intact.  ?   Pupils: Pupils are equal, round, and reactive to light.  ?Cardiovascular:  ?   Rate and Rhythm: Normal rate and regular rhythm.  ?Pulmonary:  ?   Effort: Pulmonary effort is normal.  ?   Breath sounds: Normal breath sounds.  ?Musculoskeletal:  ?    Cervical back: Normal range of motion.  ?Neurological:  ?   General: No focal deficit present.  ?   Mental Status: He is alert and oriented to person, place, and time.  ?Psychiatric:     ?   Mood and Affect: Mood normal.     ?  Behavior: Behavior normal.  ? ? ? ?UC Treatments / Results  ?Labs ?(all labs ordered are listed, but only abnormal results are displayed) ?Labs Reviewed - No data to display ? ?EKG ? ? ?Radiology ?No results found. ? ?Procedures ?Procedures (including critical care time) ? ?Medications Ordered in UC ?Medications - No data to display ? ?Initial Impression / Assessment and Plan / UC Course  ?I have reviewed the triage vital signs and the nursing notes. ? ?Pertinent labs & imaging results that were available during my care of the patient were reviewed by me and considered in my medical decision making (see chart for details). ? ?  ?Counseled patient on lipid panel and advised that this is a nonemergent work-up and requires fasting and this is likely something that will take place during his primary care visit which is scheduled with [facility] on 06/01/2021.  I did agree to refill his blood pressure medication for 7 days as he reports he is low on medicine.  His blood pressure is stable.  Patient stable and ambulatory.Go to ER if he develops any more chest pain prior to his follow-up with primary care doctor.  Patient verbalized understanding and agreed with plan ? ?Final Clinical Impressions(s) / UC Diagnoses  ? ?Final diagnoses:  ?Encounter for medication refill  ?Blood pressure check  ? ? ? ?Discharge Instructions   ? ?  ?I refilled your medication for 7 days.  Keep follow-up with your primary care doctor who will recheck your cholesterol and also manage her blood pressure.  Your blood pressure is stable today at 130/90 ? ? ?ED Prescriptions   ? ? Medication Sig Dispense Auth. Provider  ? hydrochlorothiazide (HYDRODIURIL) 25 MG tablet Take 1 tablet (25 mg total) by mouth daily for 7 days.  7 tablet Scot Jun, FNP  ? ?  ? ?PDMP not reviewed this encounter. ?  ?Scot Jun, FNP ?05/26/21 1753 ? ?

## 2021-05-26 NOTE — ED Notes (Signed)
Blood pressure checked, medication refilled by provider.  ?

## 2021-05-26 NOTE — Discharge Instructions (Signed)
I refilled your medication for 7 days.  Keep follow-up with your primary care doctor who will recheck your cholesterol and also manage her blood pressure.  Your blood pressure is stable today at 130/90 ?

## 2024-01-17 ENCOUNTER — Telehealth: Payer: Self-pay

## 2024-01-17 NOTE — Telephone Encounter (Signed)
 RN attempted to call patient to schedule colonoscopy for which he is overdue; left vm informing patient of being overdue per MD recommendation as previously communicated with patient; requested a call back to schedule.
